# Patient Record
Sex: Female | Born: 1990
Health system: Southern US, Community
[De-identification: ages and names within clinical notes are randomized; demographics above are authoritative.]

## PROBLEM LIST (undated history)

## (undated) ENCOUNTER — Inpatient Hospital Stay (HOSPITAL_COMMUNITY): Payer: Self-pay

## (undated) DIAGNOSIS — F209 Schizophrenia, unspecified: Secondary | ICD-10-CM

## (undated) DIAGNOSIS — M419 Scoliosis, unspecified: Secondary | ICD-10-CM

## (undated) DIAGNOSIS — E559 Vitamin D deficiency, unspecified: Secondary | ICD-10-CM

## (undated) DIAGNOSIS — F1920 Other psychoactive substance dependence, uncomplicated: Secondary | ICD-10-CM

## (undated) DIAGNOSIS — R87610 Atypical squamous cells of undetermined significance on cytologic smear of cervix (ASC-US): Secondary | ICD-10-CM

## (undated) DIAGNOSIS — F319 Bipolar disorder, unspecified: Secondary | ICD-10-CM

## (undated) DIAGNOSIS — J45909 Unspecified asthma, uncomplicated: Secondary | ICD-10-CM

## (undated) DIAGNOSIS — Z3490 Encounter for supervision of normal pregnancy, unspecified, unspecified trimester: Secondary | ICD-10-CM

## (undated) HISTORY — DX: Unspecified asthma, uncomplicated: J45.909

## (undated) HISTORY — DX: Other psychoactive substance dependence, uncomplicated: F19.20

## (undated) HISTORY — PX: TYMPANOSTOMY: SHX2586

## (undated) HISTORY — DX: Atypical squamous cells of undetermined significance on cytologic smear of cervix (ASC-US): R87.610

## (undated) HISTORY — PX: MOUTH SURGERY: SHX715

## (undated) HISTORY — DX: Vitamin D deficiency, unspecified: E55.9

---
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
## 2015-05-26 NOTE — L&D Delivery Note (Signed)
Delivery Note  SVD viable female Apgars 9,9 over 2nd degree MLE.  Placenta delivered spontaneously intact with 3VC. Repair with 2-0 Chromic with good support and hemostasis noted and R/V exam confirms.  PH art was sent.  [place] cord blood was not done.  Mother and baby were doing well.  EBL 200cc  Candice Campavid Sayan Aldava, MD

## 2015-09-18 LAB — OB RESULTS CONSOLE ABO/RH: RH TYPE: POSITIVE

## 2015-09-18 LAB — OB RESULTS CONSOLE GC/CHLAMYDIA
Chlamydia: NEGATIVE
GC PROBE AMP, GENITAL: NEGATIVE

## 2015-09-18 LAB — OB RESULTS CONSOLE RPR: RPR: NONREACTIVE

## 2015-09-18 LAB — OB RESULTS CONSOLE RUBELLA ANTIBODY, IGM: Rubella: IMMUNE

## 2015-09-18 LAB — OB RESULTS CONSOLE ANTIBODY SCREEN: Antibody Screen: NEGATIVE

## 2015-09-18 LAB — OB RESULTS CONSOLE HEPATITIS B SURFACE ANTIGEN: Hepatitis B Surface Ag: NEGATIVE

## 2015-09-18 LAB — OB RESULTS CONSOLE HIV ANTIBODY (ROUTINE TESTING): HIV: NONREACTIVE

## 2015-10-27 ENCOUNTER — Encounter (HOSPITAL_COMMUNITY): Payer: Self-pay

## 2015-10-27 ENCOUNTER — Inpatient Hospital Stay (HOSPITAL_COMMUNITY)
Admission: AD | Admit: 2015-10-27 | Discharge: 2015-10-27 | Disposition: A | Discharge status: Home / Self Care | Payer: 59 | Source: Ambulatory Visit | Attending: Obstetrics and Gynecology | Admitting: Obstetrics and Gynecology

## 2015-10-27 DIAGNOSIS — G8929 Other chronic pain: Secondary | ICD-10-CM | POA: Diagnosis not present

## 2015-10-27 DIAGNOSIS — Z3A20 20 weeks gestation of pregnancy: Secondary | ICD-10-CM | POA: Diagnosis not present

## 2015-10-27 DIAGNOSIS — O210 Mild hyperemesis gravidarum: Secondary | ICD-10-CM | POA: Diagnosis not present

## 2015-10-27 DIAGNOSIS — O99332 Smoking (tobacco) complicating pregnancy, second trimester: Secondary | ICD-10-CM | POA: Diagnosis not present

## 2015-10-27 DIAGNOSIS — M549 Dorsalgia, unspecified: Secondary | ICD-10-CM | POA: Diagnosis not present

## 2015-10-27 DIAGNOSIS — M419 Scoliosis, unspecified: Secondary | ICD-10-CM | POA: Diagnosis not present

## 2015-10-27 DIAGNOSIS — R197 Diarrhea, unspecified: Secondary | ICD-10-CM

## 2015-10-27 DIAGNOSIS — O219 Vomiting of pregnancy, unspecified: Secondary | ICD-10-CM

## 2015-10-27 DIAGNOSIS — R109 Unspecified abdominal pain: Secondary | ICD-10-CM | POA: Diagnosis present

## 2015-10-27 DIAGNOSIS — F17210 Nicotine dependence, cigarettes, uncomplicated: Secondary | ICD-10-CM | POA: Insufficient documentation

## 2015-10-27 HISTORY — DX: Scoliosis, unspecified: M41.9

## 2015-10-27 LAB — URINALYSIS, ROUTINE W REFLEX MICROSCOPIC
Bilirubin Urine: NEGATIVE
GLUCOSE, UA: NEGATIVE mg/dL
Hgb urine dipstick: NEGATIVE
Ketones, ur: NEGATIVE mg/dL
LEUKOCYTES UA: NEGATIVE
Nitrite: NEGATIVE
PH: 7.5 (ref 5.0–8.0)
Protein, ur: NEGATIVE mg/dL
SPECIFIC GRAVITY, URINE: 1.015 (ref 1.005–1.030)

## 2015-10-27 MED ORDER — PROMETHAZINE HCL 25 MG PO TABS: 25.0000 mg | ORAL_TABLET | Freq: Four times a day (QID) | ORAL | Status: DC | PRN

## 2015-10-27 MED ORDER — ONDANSETRON 8 MG PO TBDP
8.0000 mg | ORAL_TABLET | Freq: Once | ORAL | Status: AC
  Administered 2015-10-27: 8 mg via ORAL
  Filled 2015-10-27: qty 1

## 2015-10-27 MED ORDER — CYCLOBENZAPRINE HCL 10 MG PO TABS: 10.0000 mg | ORAL_TABLET | Freq: Three times a day (TID) | ORAL | Status: DC | PRN

## 2015-10-27 NOTE — Discharge Instructions (Signed)
Back Pain in Pregnancy °Back pain during pregnancy is common. It happens in about half of all pregnancies. It is important for you and your baby that you remain active during your pregnancy. If you feel that back pain is not allowing you to remain active or sleep well, it is time to see your caregiver. Back pain may be caused by several factors related to changes during your pregnancy. Fortunately, unless you had trouble with your back before your pregnancy, the pain is likely to get better after you deliver. °Low back pain usually occurs between the fifth and seventh months of pregnancy. It can, however, happen in the first couple months. Factors that increase the risk of back problems include:  °· Previous back problems. °· Injury to your back. °· Having twins or multiple births. °· A chronic cough. °· Stress. °· Job-related repetitive motions. °· Muscle or spinal disease in the back. °· Family history of back problems, ruptured (herniated) discs, or osteoporosis. °· Depression, anxiety, and panic attacks. °CAUSES  °· When you are pregnant, your body produces a hormone called relaxin. This hormone makes the ligaments connecting the low back and pubic bones more flexible. This flexibility allows the baby to be delivered more easily. When your ligaments are loose, your muscles need to work harder to support your back. Soreness in your back can come from tired muscles. Soreness can also come from back tissues that are irritated since they are receiving less support. °· As the baby grows, it puts pressure on the nerves and blood vessels in your pelvis. This can cause back pain. °· As the baby grows and gets heavier during pregnancy, the uterus pushes the stomach muscles forward and changes your center of gravity. This makes your back muscles work harder to maintain good posture. °SYMPTOMS  °Lumbar pain during pregnancy °Lumbar pain during pregnancy usually occurs at or above the waist in the center of the back. There  may be pain and numbness that radiates into your leg or foot. This is similar to low back pain experienced by non-pregnant women. It usually increases with sitting for long periods of time, standing, or repetitive lifting. Tenderness may also be present in the muscles along your upper back. °Posterior pelvic pain during pregnancy °Pain in the back of the pelvis is more common than lumbar pain in pregnancy. It is a deep pain felt in your side at the waistline, or across the tailbone (sacrum), or in both places. You may have pain on one or both sides. This pain can also go into the buttocks and backs of the upper thighs. Pubic and groin pain may also be present. The pain does not quickly resolve with rest, and morning stiffness may also be present. °Pelvic pain during pregnancy can be brought on by most activities. A high level of fitness before and during pregnancy may or may not prevent this problem. Labor pain is usually 1 to 2 minutes apart, lasts for about 1 minute, and involves a bearing down feeling or pressure in your pelvis. However, if you are at term with the pregnancy, constant low back pain can be the beginning of early labor, and you should be aware of this. °DIAGNOSIS  °X-rays of the back should not be done during the first 12 to 14 weeks of the pregnancy and only when absolutely necessary during the rest of the pregnancy. MRIs do not give off radiation and are safe during pregnancy. MRIs also should only be done when absolutely necessary. °HOME CARE INSTRUCTIONS °· Exercise   as directed by your caregiver. Exercise is the most effective way to prevent or manage back pain. If you have a back problem, it is especially important to avoid sports that require sudden body movements. Swimming and walking are great activities. °· Do not stand in one place for long periods of time. °· Do not wear high heels. °· Sit in chairs with good posture. Use a pillow on your lower back if necessary. Make sure your head  rests over your shoulders and is not hanging forward. °· Try sleeping on your side, preferably the left side, with a pillow or two between your legs. If you are sore after a night's rest, your bed may be too soft. Try placing a board between your mattress and box spring. °· Listen to your body when lifting. If you are experiencing pain, ask for help or try bending your knees more so you can use your leg muscles rather than your back muscles. Squat down when picking up something from the floor. Do not bend over. °· Eat a healthy diet. Try to gain weight within your caregiver's recommendations. °· Use heat or cold packs 3 to 4 times a day for 15 minutes to help with the pain. °· Only take over-the-counter or prescription medicines for pain, discomfort, or fever as directed by your caregiver. °Sudden (acute) back pain °· Use bed rest for only the most extreme, acute episodes of back pain. Prolonged bed rest over 48 hours will aggravate your condition. °· Ice is very effective for acute conditions. °¨ Put ice in a plastic bag. °¨ Place a towel between your skin and the bag. °¨ Leave the ice on for 10 to 20 minutes every 2 hours, or as needed. °· Using heat packs for 30 minutes prior to activities is also helpful. °Continued back pain °See your caregiver if you have continued problems. Your caregiver can help or refer you for appropriate physical therapy. With conditioning, most back problems can be avoided. Sometimes, a more serious issue may be the cause of back pain. You should be seen right away if new problems seem to be developing. Your caregiver may recommend: °· A maternity girdle. °· An elastic sling. °· A back brace. °· A massage therapist or acupuncture. °SEEK MEDICAL CARE IF:  °· You are not able to do most of your daily activities, even when taking the pain medicine you were given. °· You need a referral to a physical therapist or chiropractor. °· You want to try acupuncture. °SEEK IMMEDIATE MEDICAL CARE  IF: °· You develop numbness, tingling, weakness, or problems with the use of your arms or legs. °· You develop severe back pain that is no longer relieved with medicines. °· You have a sudden change in bowel or bladder control. °· You have increasing pain in other areas of the body. °· You develop shortness of breath, dizziness, or fainting. °· You develop nausea, vomiting, or sweating. °· You have back pain which is similar to labor pains. °· You have back pain along with your water breaking or vaginal bleeding. °· You have back pain or numbness that travels down your leg. °· Your back pain developed after you fell. °· You develop pain on one side of your back. You may have a kidney stone. °· You see blood in your urine. You may have a bladder infection or kidney stone. °· You have back pain with blisters. You may have shingles. °Back pain is fairly common during pregnancy but should not be accepted as just part of   the process. Back pain should always be treated as soon as possible. This will make your pregnancy as pleasant as possible.   This information is not intended to replace advice given to you by your health care provider. Make sure you discuss any questions you have with your health care provider.   Document Released: 08/19/2005 Document Revised: 08/03/2011 Document Reviewed: 09/30/2010 Elsevier Interactive Patient Education 2016 Elsevier Inc.     Diarrhea Diarrhea is watery poop (stool). It can make you feel weak, tired, thirsty, or give you a dry mouth (signs of dehydration). Watery poop is a sign of another problem, most often an infection. It often lasts 2-3 days. It can last longer if it is a sign of something serious. Take care of yourself as told by your doctor. HOME CARE   Drink 1 cup (8 ounces) of fluid each time you have watery poop.  Do not drink the following fluids:  Those that contain simple sugars (fructose, glucose, galactose, lactose, sucrose, maltose).  Sports  drinks.  Fruit juices.  Whole milk products.  Sodas.  Drinks with caffeine (coffee, tea, soda) or alcohol.  Oral rehydration solution may be used if the doctor says it is okay. You may make your own solution. Follow this recipe:   - teaspoon table salt.   teaspoon baking soda.   teaspoon salt substitute containing potassium chloride.  1 tablespoons sugar.  1 liter (34 ounces) of water.  Avoid the following foods:  High fiber foods, such as raw fruits and vegetables.  Nuts, seeds, and whole grain breads and cereals.   Those that are sweetened with sugar alcohols (xylitol, sorbitol, mannitol).  Try eating the following foods:  Starchy foods, such as rice, toast, pasta, low-sugar cereal, oatmeal, baked potatoes, crackers, and bagels.  Bananas.  Applesauce.  Eat probiotic-rich foods, such as yogurt and milk products that are fermented.  Wash your hands well after each time you have watery poop.  Only take medicine as told by your doctor.  Take a warm bath to help lessen burning or pain from having watery poop. GET HELP RIGHT AWAY IF:   You cannot drink fluids without throwing up (vomiting).  You keep throwing up.  You have blood in your poop, or your poop looks black and tarry.  You do not pee (urinate) in 6-8 hours, or there is only a small amount of very dark pee.  You have belly (abdominal) pain that gets worse or stays in the same spot (localizes).  You are weak, dizzy, confused, or light-headed.  You have a very bad headache.  Your watery poop gets worse or does not get better.  You have a fever or lasting symptoms for more than 2-3 days.  You have a fever and your symptoms suddenly get worse. MAKE SURE YOU:   Understand these instructions.  Will watch your condition.  Will get help right away if you are not doing well or get worse.   This information is not intended to replace advice given to you by your health care provider. Make sure you  discuss any questions you have with your health care provider.   Document Released: 10/28/2007 Document Revised: 06/01/2014 Document Reviewed: 01/17/2012 Elsevier Interactive Patient Education [employer]2016 Elsevier Inc.

## 2015-10-27 NOTE — MAU Note (Signed)
Patient presents with c/o diarrhea x 2 days with 3 episodes today, vomiting has been throughout pregnancy, having back pain and lower abdominal pain.

## 2015-10-27 NOTE — MAU Provider Note (Signed)
History     CSN: [id number]  Arrival date and time: 10/27/15 1328   First Provider Initiated Contact with Patient 10/27/15 1428      Chief Complaint  Patient presents with  . Morning Sickness  . Diarrhea  . Abdominal Pain  . Back Pain   HPI  Kara Love is a 25 y.o. G1P0 at 951w5d who presents with multiple complaints including n/v/d, abdominal pain, & back pain.  -N/v throughout pregnancy. Vomited twice today; states vomits daily. Does not have medications at home for nausea. Is able to tolerate foods/liquids -Reports diarrhea since yesterday x 3 stools. States 2 stools yesterday were loose & had 1 watery stool this morning. Denies blood in stool, fever, recent abx or hospitalization, or sick contacts.  -LLQ pain since yesterday. Rates 6/10. Describes as constant sore pain that also feels like menstrual cramps. Has not treated.  -Low back pain that feels like spasms. Reports history of chronic back pain. Rates 5/10. Has not treated. Reports using tylenol at the beginning of pregnancy without relief. States she has used flexeril in that past without relief. Pain is worse while working & states she is about to lose her job d/t the pain. States she was in the office a few weeks ago & was told by Dr. Renaldo FiddlerAdkins that the only medication that would be safe for her to take during pregnancy was hydrocodone but that Dr. Renaldo FiddlerAdkins forgot to prescribe it for her.   OB History    Gravida Para Term Preterm AB TAB SAB Ectopic Multiple Living   1               Past Medical History  Diagnosis Date  . Scoliosis     Past Surgical History  Procedure Laterality Date  . Tympanostomy      History reviewed. No pertinent family history.  Social History  Substance Use Topics  . Smoking status: Current Every Day Smoker -- 0.50 packs/day    Types: Cigarettes  . Smokeless tobacco: None  . Alcohol Use: No    Allergies:  Allergies  Allergen Reactions  . Cefzil [Cefprozil] Rash    Prescriptions  prior to admission  Medication Sig Dispense Refill Last Dose  . Prenatal Vit-Fe Fumarate-FA (PRENATAL MULTIVITAMIN) TABS tablet Take 1 tablet by mouth daily at 12 noon.   Past Week at Unknown time    Review of Systems  Constitutional: Negative.   Gastrointestinal: Positive for nausea, vomiting, abdominal pain and diarrhea. Negative for constipation, blood in stool and melena.  Genitourinary: Negative for dysuria and flank pain.       No vaginal bleeding or LOF  Musculoskeletal: Positive for back pain.   Physical Exam   Blood pressure 113/55, pulse 85, temperature 98.2 F (36.8 C), temperature source Oral, resp. rate 18, height 5\' 2"  (1.575 m), weight 131 lb 1.9 oz (59.476 kg).  Physical Exam  Nursing note and vitals reviewed. Constitutional: She is oriented to person, place, and time. She appears well-developed and well-nourished. No distress.  HENT:  Head: Normocephalic and atraumatic.  Eyes: Conjunctivae are normal. Right eye exhibits no discharge. Left eye exhibits no discharge. No scleral icterus.  Neck: Normal range of motion.  Cardiovascular: Normal rate, regular rhythm and normal heart sounds.   No murmur heard. Respiratory: Effort normal and breath sounds normal. No respiratory distress. She has no wheezes.  GI: Soft. Bowel sounds are normal. She exhibits distension. There is no tenderness. There is no rebound and no guarding.  Neurological:  She is alert and oriented to person, place, and time.  Skin: Skin is warm and dry. She is not diaphoretic.  Psychiatric: She has a normal mood and affect. Her behavior is normal. Judgment and thought content normal.    MAU Course  Procedures Results for orders placed or performed during the hospital encounter of 10/27/15 (from the past 24 hour(s))  Urinalysis, Routine w reflex microscopic (not at [facility])     Status: None   Collection Time: 10/27/15  1:50 PM  Result Value Ref Range   Color, Urine YELLOW YELLOW   APPearance CLEAR CLEAR    Specific Gravity, Urine 1.015 1.005 - 1.030   pH 7.5 5.0 - 8.0   Glucose, UA NEGATIVE NEGATIVE mg/dL   Hgb urine dipstick NEGATIVE NEGATIVE   Bilirubin Urine NEGATIVE NEGATIVE   Ketones, ur NEGATIVE NEGATIVE mg/dL   Protein, ur NEGATIVE NEGATIVE mg/dL   Nitrite NEGATIVE NEGATIVE   Leukocytes, UA NEGATIVE NEGATIVE    MDM FHT 151 by doppler Cervix closed No vomiting or diarrhea while in MAU Zofran  odt  Pt in no apparent distress VSS S/w Dr. Rana Snare. Discharge home with rx for phenergan & flexeril. Instruct pt to apply ice/heat/massage to affected area on back & f/u with Dr. Renaldo Fiddler if symptoms don't improve in 2 days.   Assessment and Plan  A: 1. Chronic back pain   2. Nausea and vomiting during pregnancy prior to [redacted] weeks gestation   3. Diarrhea, unspecified type     P; Discharge home Rx phenergan & flexeril Recommended maternity support belt Alternate ice/heat applications to back F/u with Dr. Renaldo Fiddler if symptoms don't improve BRAT diet for diarrhea Discussed reasons to return to MAU  Judeth Horn 10/27/2015, 2:14 PM

## 2015-11-10 ENCOUNTER — Encounter (HOSPITAL_COMMUNITY): Payer: Self-pay | Admitting: *Deleted

## 2015-11-10 ENCOUNTER — Inpatient Hospital Stay (HOSPITAL_COMMUNITY)
Admission: AD | Admit: 2015-11-10 | Discharge: 2015-11-10 | Disposition: A | Discharge status: Home / Self Care | Payer: 59 | Source: Ambulatory Visit | Attending: Obstetrics & Gynecology | Admitting: Obstetrics & Gynecology

## 2015-11-10 DIAGNOSIS — M4120 Other idiopathic scoliosis, site unspecified: Secondary | ICD-10-CM

## 2015-11-10 DIAGNOSIS — O26892 Other specified pregnancy related conditions, second trimester: Secondary | ICD-10-CM | POA: Diagnosis not present

## 2015-11-10 DIAGNOSIS — F17210 Nicotine dependence, cigarettes, uncomplicated: Secondary | ICD-10-CM | POA: Insufficient documentation

## 2015-11-10 DIAGNOSIS — O99891 Other specified diseases and conditions complicating pregnancy: Secondary | ICD-10-CM

## 2015-11-10 DIAGNOSIS — Z3A22 22 weeks gestation of pregnancy: Secondary | ICD-10-CM | POA: Diagnosis not present

## 2015-11-10 DIAGNOSIS — O99332 Smoking (tobacco) complicating pregnancy, second trimester: Secondary | ICD-10-CM | POA: Diagnosis not present

## 2015-11-10 DIAGNOSIS — M419 Scoliosis, unspecified: Secondary | ICD-10-CM | POA: Diagnosis not present

## 2015-11-10 DIAGNOSIS — M549 Dorsalgia, unspecified: Secondary | ICD-10-CM | POA: Diagnosis not present

## 2015-11-10 DIAGNOSIS — O9989 Other specified diseases and conditions complicating pregnancy, childbirth and the puerperium: Secondary | ICD-10-CM

## 2015-11-10 MED ORDER — OXYCODONE-ACETAMINOPHEN 5-325 MG PO TABS
1.0000 | ORAL_TABLET | Freq: Once | ORAL | Status: AC
  Administered 2015-11-10: 1 via ORAL
  Filled 2015-11-10: qty 1

## 2015-11-10 NOTE — MAU Provider Note (Signed)
  History     CSN: [id number]  Arrival date and time: 11/10/15 1418   None     Chief Complaint  Patient presents with  . Back Pain   HPI Pt is 2963w5d pregnant G1P0 who has hx of scoliosis and states that she was on percocet prior to pregnancy from her PCP.  Pt was seen in MAU on 10/27/2015 and given a prescription for flexeril.  Pt states that is not working for patient. Pt was to contact office for follow up if the flexeril did not help.(not sure why pt was unable to contact office for follow up)  Pt states her back is becoming more painful and having difficulty working.  Pt also has prescription for phenergan which she has also tried with tylenol has not helped either. Pt states she called call a nurse at the office and they told her to come to MAU. Pt denies abd pain, vaginal bleeding or spotting  Past Medical History  Diagnosis Date  . Scoliosis     Past Surgical History  Procedure Laterality Date  . Tympanostomy      No family history on file.  Social History  Substance Use Topics  . Smoking status: Current Every Day Smoker -- 0.50 packs/day    Types: Cigarettes  . Smokeless tobacco: None  . Alcohol Use: No    Allergies:  Allergies  Allergen Reactions  . Cefzil [Cefprozil] Rash    Prescriptions prior to admission  Medication Sig Dispense Refill Last Dose  . cyclobenzaprine (FLEXERIL) 10 MG tablet Take 1 tablet (10 mg total) by mouth 3 (three) times daily as needed for muscle spasms. 20 tablet 0   . Prenatal Vit-Fe Fumarate-FA (PRENATAL MULTIVITAMIN) TABS tablet Take 1 tablet by mouth daily at 12 noon.   Past Week at Unknown time  . promethazine (PHENERGAN) 25 MG tablet Take 1 tablet (25 mg total) by mouth every 6 (six) hours as needed for nausea or vomiting. 30 tablet 0     Review of Systems  Constitutional: Negative for fever and chills.  Gastrointestinal: Negative for nausea, vomiting, abdominal pain, diarrhea and constipation.  Genitourinary: Negative for  dysuria.  Musculoskeletal: Positive for back pain.  Neurological: Negative for headaches.   Physical Exam   Blood pressure 109/56, pulse 91, temperature 98.2 F (36.8 C), resp. rate 18, height 5\' 2"  (1.575 m), weight 133 lb (60.328 kg).  Physical Exam  Nursing note and vitals reviewed. Constitutional: She is oriented to person, place, and time. She appears well-developed and well-nourished. No distress.  Eyes: Pupils are equal, round, and reactive to light.  Neck: Normal range of motion. Neck supple.  Cardiovascular: Normal rate.   Respiratory: Effort normal.  No CVA tenderness  GI: Soft. She exhibits no distension. There is no tenderness.  Musculoskeletal: Normal range of motion. She exhibits no edema.  Neurological: She is alert and oriented to person, place, and time.  Skin: Skin is warm and dry.  Psychiatric: She has a normal mood and affect.    MAU Course  Procedures FHT 155 with doppler Discussed with Dr. Langston MaskerMorris- will give pt one Percocet- pt will need to follow up with office tomorrow. -( pt very unhappy that she came here to MAU and did not get a prescription) Assessment and Plan  Back pain in pregnancy - second trimester Scoliosis F/u with OB tomorrow  Pamelia HoitLINEBERRY,Jayona Mccaig 11/10/2015, 2:50 PM

## 2015-11-10 NOTE — Discharge Instructions (Signed)

## 2015-11-10 NOTE — MAU Note (Signed)
Back pain since prior to pregnancy. Hx of scoliosis.  Took flexeril for 1 week with little relief

## 2016-01-10 ENCOUNTER — Emergency Department (HOSPITAL_COMMUNITY)
Admission: EM | Admit: 2016-01-10 | Discharge: 2016-01-11 | Disposition: A | Discharge status: Home / Self Care | Payer: 59 | Attending: Emergency Medicine | Admitting: Emergency Medicine

## 2016-01-10 ENCOUNTER — Encounter (HOSPITAL_COMMUNITY): Payer: Self-pay

## 2016-01-10 ENCOUNTER — Emergency Department (HOSPITAL_COMMUNITY): Payer: 59

## 2016-01-10 DIAGNOSIS — F17210 Nicotine dependence, cigarettes, uncomplicated: Secondary | ICD-10-CM | POA: Diagnosis not present

## 2016-01-10 DIAGNOSIS — S0181XA Laceration without foreign body of other part of head, initial encounter: Secondary | ICD-10-CM | POA: Diagnosis not present

## 2016-01-10 DIAGNOSIS — Y92410 Unspecified street and highway as the place of occurrence of the external cause: Secondary | ICD-10-CM | POA: Insufficient documentation

## 2016-01-10 DIAGNOSIS — Y999 Unspecified external cause status: Secondary | ICD-10-CM | POA: Diagnosis not present

## 2016-01-10 DIAGNOSIS — Z79899 Other long term (current) drug therapy: Secondary | ICD-10-CM | POA: Insufficient documentation

## 2016-01-10 DIAGNOSIS — S0191XA Laceration without foreign body of unspecified part of head, initial encounter: Secondary | ICD-10-CM

## 2016-01-10 DIAGNOSIS — Z3A31 31 weeks gestation of pregnancy: Secondary | ICD-10-CM | POA: Diagnosis not present

## 2016-01-10 DIAGNOSIS — O9A213 Injury, poisoning and certain other consequences of external causes complicating pregnancy, third trimester: Secondary | ICD-10-CM | POA: Diagnosis not present

## 2016-01-10 DIAGNOSIS — F1910 Other psychoactive substance abuse, uncomplicated: Secondary | ICD-10-CM | POA: Diagnosis not present

## 2016-01-10 DIAGNOSIS — O99333 Smoking (tobacco) complicating pregnancy, third trimester: Secondary | ICD-10-CM | POA: Diagnosis not present

## 2016-01-10 DIAGNOSIS — Y9389 Activity, other specified: Secondary | ICD-10-CM | POA: Diagnosis not present

## 2016-01-10 DIAGNOSIS — S0990XA Unspecified injury of head, initial encounter: Secondary | ICD-10-CM | POA: Diagnosis present

## 2016-01-10 DIAGNOSIS — Z5181 Encounter for therapeutic drug level monitoring: Secondary | ICD-10-CM | POA: Insufficient documentation

## 2016-01-10 DIAGNOSIS — Z3490 Encounter for supervision of normal pregnancy, unspecified, unspecified trimester: Secondary | ICD-10-CM

## 2016-01-10 HISTORY — DX: Encounter for supervision of normal pregnancy, unspecified, unspecified trimester: Z34.90

## 2016-01-10 LAB — COMPREHENSIVE METABOLIC PANEL
ALK PHOS: 73 U/L (ref 38–126)
ALT: 12 U/L — AB (ref 14–54)
AST: 20 U/L (ref 15–41)
Albumin: 3.4 g/dL — ABNORMAL LOW (ref 3.5–5.0)
Anion gap: 6 (ref 5–15)
BILIRUBIN TOTAL: 0.7 mg/dL (ref 0.3–1.2)
BUN: 6 mg/dL (ref 6–20)
CALCIUM: 8.3 mg/dL — AB (ref 8.9–10.3)
CHLORIDE: 105 mmol/L (ref 101–111)
CO2: 23 mmol/L (ref 22–32)
CREATININE: 0.5 mg/dL (ref 0.44–1.00)
Glucose, Bld: 90 mg/dL (ref 65–99)
Potassium: 3.1 mmol/L — ABNORMAL LOW (ref 3.5–5.1)
Sodium: 134 mmol/L — ABNORMAL LOW (ref 135–145)
Total Protein: 6.3 g/dL — ABNORMAL LOW (ref 6.5–8.1)

## 2016-01-10 LAB — TYPE AND SCREEN
ABO/RH(D): A POS
Antibody Screen: NEGATIVE

## 2016-01-10 LAB — PROTIME-INR
INR: 0.95
Prothrombin Time: 12.7 seconds (ref 11.4–15.2)

## 2016-01-10 LAB — CBC
HCT: 35.2 % — ABNORMAL LOW (ref 36.0–46.0)
Hemoglobin: 12 g/dL (ref 12.0–15.0)
MCH: 31.7 pg (ref 26.0–34.0)
MCHC: 34.1 g/dL (ref 30.0–36.0)
MCV: 92.9 fL (ref 78.0–100.0)
PLATELETS: 281 10*3/uL (ref 150–400)
RBC: 3.79 MIL/uL — AB (ref 3.87–5.11)
RDW: 12.5 % (ref 11.5–15.5)
WBC: 18.7 10*3/uL — AB (ref 4.0–10.5)

## 2016-01-10 LAB — ETHANOL

## 2016-01-10 MED ORDER — SODIUM CHLORIDE 0.9 % IV SOLN: 1000.0000 mL | INTRAVENOUS | Status: DC

## 2016-01-10 MED ORDER — SODIUM CHLORIDE 0.9 % IV SOLN
1000.0000 mL | Freq: Once | INTRAVENOUS | Status: AC
  Administered 2016-01-10: 1000 mL via INTRAVENOUS
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
## 2016-01-10 NOTE — ED Notes (Signed)
Patient transported to CT 

## 2016-01-10 NOTE — ED Notes (Addendum)
While placing patients belongings into a belongings bag, a small bag with 4 tablets (2 light green rectangular pills and 2 white oval pills) fell out of the patients pocket.  Pt admitted that they were xanax and vicodin but that she hadn't taken any recently.
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
## 2016-01-10 NOTE — Discharge Instructions (Signed)
Return if you have any pain in your abdomen. Return if you do not feel the baby moving or have vaginal bleeding.  Recheck with your doctor this week.
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
## 2016-01-11 LAB — RAPID URINE DRUG SCREEN, HOSP PERFORMED
Amphetamines: NOT DETECTED
Barbiturates: NOT DETECTED
Benzodiazepines: POSITIVE — AB
COCAINE: NOT DETECTED
OPIATES: POSITIVE — AB
TETRAHYDROCANNABINOL: POSITIVE — AB

## 2016-01-11 LAB — URINE MICROSCOPIC-ADD ON

## 2016-01-11 LAB — URINALYSIS, ROUTINE W REFLEX MICROSCOPIC
BILIRUBIN URINE: NEGATIVE
Glucose, UA: NEGATIVE mg/dL
NITRITE: NEGATIVE
PROTEIN: NEGATIVE mg/dL
pH: 6.5 (ref 5.0–8.0)

## 2016-01-11 NOTE — ED Notes (Signed)
[facility]. Stated everything looks good on their end. Pt can be discharged after 4 hour monitor time.

## 2016-01-11 NOTE — ED Notes (Signed)
Pt alert & oriented x4, stable gait. Patient given discharge instructions, paperwork & prescription(s). Patient  instructed to stop at the registration desk to finish any additional paperwork. Patient verbalized understanding. Pt left department w/ no further questions. 

## 2016-01-24 ENCOUNTER — Ambulatory Visit (HOSPITAL_COMMUNITY)
Admission: RE | Admit: 2016-01-24 | Discharge: 2016-01-24 | Disposition: A | Discharge status: Home / Self Care | Payer: No Typology Code available for payment source | Source: Ambulatory Visit | Attending: Obstetrics and Gynecology | Admitting: Obstetrics and Gynecology

## 2016-01-28 ENCOUNTER — Ambulatory Visit (HOSPITAL_COMMUNITY): Payer: No Typology Code available for payment source

## 2016-01-31 ENCOUNTER — Encounter (HOSPITAL_COMMUNITY): Payer: Self-pay

## 2016-01-31 ENCOUNTER — Ambulatory Visit (HOSPITAL_COMMUNITY)
Admission: RE | Admit: 2016-01-31 | Discharge: 2016-01-31 | Disposition: A | Discharge status: Home / Self Care | Payer: 59 | Source: Ambulatory Visit | Attending: Obstetrics and Gynecology | Admitting: Obstetrics and Gynecology

## 2016-01-31 DIAGNOSIS — M419 Scoliosis, unspecified: Secondary | ICD-10-CM | POA: Insufficient documentation

## 2016-01-31 DIAGNOSIS — O99323 Drug use complicating pregnancy, third trimester: Secondary | ICD-10-CM | POA: Diagnosis present

## 2016-01-31 DIAGNOSIS — Z3A34 34 weeks gestation of pregnancy: Secondary | ICD-10-CM | POA: Diagnosis not present

## 2016-01-31 DIAGNOSIS — O99333 Smoking (tobacco) complicating pregnancy, third trimester: Secondary | ICD-10-CM | POA: Diagnosis not present

## 2016-01-31 DIAGNOSIS — F17210 Nicotine dependence, cigarettes, uncomplicated: Secondary | ICD-10-CM | POA: Diagnosis not present

## 2016-01-31 DIAGNOSIS — F1190 Opioid use, unspecified, uncomplicated: Secondary | ICD-10-CM | POA: Insufficient documentation

## 2016-01-31 LAB — OB RESULTS CONSOLE GBS: STREP GROUP B AG: NEGATIVE

## 2016-01-31 NOTE — Progress Notes (Signed)
MATERNAL FETAL MEDICINE CONSULT  Patient Name: Kara StainKaitlyn M Love Medical Record Number:  [id number] Date of Birth: 06-05-1990 Requesting Physician Name:  Kara CairoGretchen Adkins, MD Date of Service: 01/31/2016  Chief Complaint Opiate use in setting of chronic back pain  History of Present Illness Kara Love was seen today secondary to opiate use in the setting of chronic back pain at the request of Kara CairoGretchen Adkins, MD.  The patient is a 25 y.o. G1P0,at 10353w3d with an EDD of 03/10/2016, by Ultrasound dating method.  Kara Love has chronic back pain secondary to scoliosis.  This has progressively worsened throughout pregnancy such that it is most often a 7/10 pain.  Earlier in pregnancy she was given percocet for the pain, but his was not renewed due to concern for neonatal abstinence.  She has no had any opiates in several weeks now and is managing with her pain.  She has had to be off from work at [employer] as the lifting involved in her job exacerbated her back pain.  She has no other issues or complaints today.  Review of Systems Pertinent items are noted in HPI.  Patient History OB History  Gravida Para Term Preterm AB Living  1            SAB TAB Ectopic Multiple Live Births               # Outcome Date GA Lbr Len/2nd Weight Sex Delivery Anes PTL Lv  1 Current               Past Medical History:  Diagnosis Date  . Pregnant   . Scoliosis     Past Surgical History:  Procedure Laterality Date  . TYMPANOSTOMY      Social History   Social History  . Marital status: Single    Spouse name: N/A  . Number of children: N/A  . Years of education: N/A   Social History Main Topics  . Smoking status: Current Every Day Smoker    Packs/day: 0.50    Types: Cigarettes  . Smokeless tobacco: Never Used  . Alcohol use No  . Drug use:     Types: Benzodiazepines, Cocaine, Heroin, Marijuana     Comment: stopped THC 3 wks ago, denies heroin or cocaine during pregnancy  . Sexual activity:  Yes    Birth control/ protection: None   Other Topics Concern  . None   Social History Narrative  . None    Family History Kara Love has no family history of mental retardation, birth defects, or genetic diseases.  Physical Examination Vitals:   01/31/16 1017  BP: 108/64  Pulse: 80   General appearance - alert, well appearing, and in no distress Mental status - alert, oriented to person, place, and time  Assessment and Recommendations 1.  Opiate use in the setting of low back pain.  As Kara Love only use opiates for a short period of time during pregnancy and is no longer taking them, the risk of neonatal abstinence is very low.  I concur with her primary OB that being off from work and physical therapy are the most appropriate treatments at this time.  Chronic opiate use for pain control is not warranted.  I spent 20 minutes with Kara Love today of which 50% was face-to-face counseling.  [facility].  Please do not hesitate to contact us with questions.   Rema FendtNITSCHE,Tasharra Nodine, MD

## 2016-02-04 ENCOUNTER — Other Ambulatory Visit (HOSPITAL_COMMUNITY): Payer: Self-pay

## 2016-02-25 ENCOUNTER — Institutional Professional Consult (permissible substitution): Payer: Self-pay | Admitting: Pediatrics

## 2016-03-04 ENCOUNTER — Encounter (HOSPITAL_COMMUNITY): Payer: Self-pay | Admitting: *Deleted

## 2016-03-04 ENCOUNTER — Telehealth (HOSPITAL_COMMUNITY): Payer: Self-pay | Admitting: *Deleted

## 2016-03-04 NOTE — Telephone Encounter (Signed)
Pt admits to using marijuana one month ago as of 03/04/2016

## 2016-03-04 NOTE — Telephone Encounter (Signed)
Preadmission screen  

## 2016-03-07 ENCOUNTER — Encounter (HOSPITAL_COMMUNITY): Payer: Self-pay | Admitting: *Deleted

## 2016-03-07 ENCOUNTER — Inpatient Hospital Stay (HOSPITAL_COMMUNITY)
Admission: AD | Admit: 2016-03-07 | Discharge: 2016-03-09 | DRG: 775 | Disposition: A | Discharge status: Home / Self Care | Payer: 59 | Source: Ambulatory Visit | Attending: Obstetrics and Gynecology | Admitting: Obstetrics and Gynecology

## 2016-03-07 DIAGNOSIS — O99324 Drug use complicating childbirth: Secondary | ICD-10-CM | POA: Diagnosis present

## 2016-03-07 DIAGNOSIS — F17210 Nicotine dependence, cigarettes, uncomplicated: Secondary | ICD-10-CM | POA: Diagnosis present

## 2016-03-07 DIAGNOSIS — O99334 Smoking (tobacco) complicating childbirth: Secondary | ICD-10-CM | POA: Diagnosis present

## 2016-03-07 DIAGNOSIS — Z3A39 39 weeks gestation of pregnancy: Secondary | ICD-10-CM | POA: Diagnosis not present

## 2016-03-07 DIAGNOSIS — Z8249 Family history of ischemic heart disease and other diseases of the circulatory system: Secondary | ICD-10-CM

## 2016-03-07 DIAGNOSIS — R109 Unspecified abdominal pain: Secondary | ICD-10-CM | POA: Diagnosis present

## 2016-03-07 DIAGNOSIS — Z833 Family history of diabetes mellitus: Secondary | ICD-10-CM | POA: Diagnosis not present

## 2016-03-07 DIAGNOSIS — M419 Scoliosis, unspecified: Secondary | ICD-10-CM | POA: Diagnosis present

## 2016-03-07 DIAGNOSIS — F1110 Opioid abuse, uncomplicated: Secondary | ICD-10-CM | POA: Diagnosis present

## 2016-03-07 DIAGNOSIS — Z3403 Encounter for supervision of normal first pregnancy, third trimester: Secondary | ICD-10-CM | POA: Diagnosis present

## 2016-03-07 LAB — CBC
HCT: 37.5 % (ref 36.0–46.0)
HEMOGLOBIN: 13.1 g/dL (ref 12.0–15.0)
MCH: 31.6 pg (ref 26.0–34.0)
MCHC: 34.9 g/dL (ref 30.0–36.0)
MCV: 90.6 fL (ref 78.0–100.0)
Platelets: 306 10*3/uL (ref 150–400)
RBC: 4.14 MIL/uL (ref 3.87–5.11)
RDW: 13.6 % (ref 11.5–15.5)
WBC: 22.4 10*3/uL — ABNORMAL HIGH (ref 4.0–10.5)

## 2016-03-07 LAB — TYPE AND SCREEN
ABO/RH(D): A POS
Antibody Screen: NEGATIVE

## 2016-03-07 LAB — ABO/RH: ABO/RH(D): A POS

## 2016-03-07 MED ORDER — ONDANSETRON HCL 4 MG PO TABS: 4.0000 mg | ORAL_TABLET | ORAL | Status: DC | PRN

## 2016-03-07 MED ORDER — OXYCODONE-ACETAMINOPHEN 5-325 MG PO TABS: 2.0000 | ORAL_TABLET | ORAL | Status: DC | PRN

## 2016-03-07 MED ORDER — IBUPROFEN 600 MG PO TABS
600.0000 mg | ORAL_TABLET | Freq: Four times a day (QID) | ORAL | Status: DC
  Administered 2016-03-07 – 2016-03-09 (×6): 600 mg via ORAL
  Filled 2016-03-07 (×6): qty 1

## 2016-03-07 MED ORDER — INFLUENZA VAC SPLIT QUAD 0.5 ML IM SUSY
0.5000 mL | PREFILLED_SYRINGE | INTRAMUSCULAR | Status: AC
  Administered 2016-03-08: 0.5 mL via INTRAMUSCULAR
  Filled 2016-03-07: qty 0.5

## 2016-03-07 MED ORDER — IBUPROFEN 600 MG PO TABS
600.0000 mg | ORAL_TABLET | Freq: Once | ORAL | Status: AC
  Administered 2016-03-07: 600 mg via ORAL
  Filled 2016-03-07: qty 1

## 2016-03-07 MED ORDER — ACETAMINOPHEN 325 MG PO TABS: 650.0000 mg | ORAL_TABLET | ORAL | Status: DC | PRN

## 2016-03-07 MED ORDER — PNEUMOCOCCAL VAC POLYVALENT 25 MCG/0.5ML IJ INJ
0.5000 mL | INJECTION | INTRAMUSCULAR | Status: DC
  Filled 2016-03-07: qty 0.5

## 2016-03-07 MED ORDER — COCONUT OIL OIL: 1.0000 "application " | TOPICAL_OIL | Status: DC | PRN

## 2016-03-07 MED ORDER — DIPHENHYDRAMINE HCL 25 MG PO CAPS: 25.0000 mg | ORAL_CAPSULE | Freq: Four times a day (QID) | ORAL | Status: DC | PRN

## 2016-03-07 MED ORDER — MEASLES, MUMPS & RUBELLA VAC ~~LOC~~ INJ
0.5000 mL | INJECTION | Freq: Once | SUBCUTANEOUS | Status: DC
  Filled 2016-03-07: qty 0.5

## 2016-03-07 MED ORDER — MEDROXYPROGESTERONE ACETATE 150 MG/ML IM SUSP: 150.0000 mg | INTRAMUSCULAR | Status: DC | PRN

## 2016-03-07 MED ORDER — WITCH HAZEL-GLYCERIN EX PADS: 1.0000 "application " | MEDICATED_PAD | CUTANEOUS | Status: DC | PRN

## 2016-03-07 MED ORDER — LIDOCAINE HCL (PF) 1 % IJ SOLN
30.0000 mL | INTRAMUSCULAR | Status: DC | PRN
  Administered 2016-03-07: 30 mL via SUBCUTANEOUS
  Filled 2016-03-07: qty 30

## 2016-03-07 MED ORDER — ZOLPIDEM TARTRATE 5 MG PO TABS: 5.0000 mg | ORAL_TABLET | Freq: Every evening | ORAL | Status: DC | PRN

## 2016-03-07 MED ORDER — OXYCODONE-ACETAMINOPHEN 5-325 MG PO TABS: 1.0000 | ORAL_TABLET | ORAL | Status: DC | PRN

## 2016-03-07 MED ORDER — TETANUS-DIPHTH-ACELL PERTUSSIS 5-2.5-18.5 LF-MCG/0.5 IM SUSP: 0.5000 mL | Freq: Once | INTRAMUSCULAR | Status: DC

## 2016-03-07 MED ORDER — LACTATED RINGERS IV SOLN: 500.0000 mL | INTRAVENOUS | Status: DC | PRN

## 2016-03-07 MED ORDER — ONDANSETRON HCL 4 MG/2ML IJ SOLN: 4.0000 mg | INTRAMUSCULAR | Status: DC | PRN

## 2016-03-07 MED ORDER — OXYCODONE-ACETAMINOPHEN 5-325 MG PO TABS
2.0000 | ORAL_TABLET | ORAL | Status: DC | PRN
  Administered 2016-03-07 – 2016-03-09 (×9): 2 via ORAL
  Filled 2016-03-07 (×9): qty 2

## 2016-03-07 MED ORDER — SENNOSIDES-DOCUSATE SODIUM 8.6-50 MG PO TABS
2.0000 | ORAL_TABLET | ORAL | Status: DC
  Administered 2016-03-07 – 2016-03-08 (×2): 2 via ORAL
  Filled 2016-03-07 (×2): qty 2

## 2016-03-07 MED ORDER — DIBUCAINE 1 % RE OINT: 1.0000 "application " | TOPICAL_OINTMENT | RECTAL | Status: DC | PRN

## 2016-03-07 MED ORDER — LACTATED RINGERS IV SOLN: INTRAVENOUS | Status: DC

## 2016-03-07 MED ORDER — BENZOCAINE-MENTHOL 20-0.5 % EX AERO
1.0000 "application " | INHALATION_SPRAY | CUTANEOUS | Status: DC | PRN
  Administered 2016-03-09: 1 via TOPICAL
  Filled 2016-03-07 (×2): qty 56

## 2016-03-07 MED ORDER — CYCLOBENZAPRINE HCL 10 MG PO TABS
10.0000 mg | ORAL_TABLET | Freq: Three times a day (TID) | ORAL | Status: DC | PRN
  Filled 2016-03-07: qty 1

## 2016-03-07 MED ORDER — OXYTOCIN BOLUS FROM INFUSION: 500.0000 mL | Freq: Once | INTRAVENOUS | Status: DC

## 2016-03-07 MED ORDER — ONDANSETRON HCL 4 MG/2ML IJ SOLN: 4.0000 mg | Freq: Four times a day (QID) | INTRAMUSCULAR | Status: DC | PRN

## 2016-03-07 MED ORDER — FLEET ENEMA 7-19 GM/118ML RE ENEM: 1.0000 | ENEMA | Freq: Every day | RECTAL | Status: DC | PRN

## 2016-03-07 MED ORDER — SOD CITRATE-CITRIC ACID 500-334 MG/5ML PO SOLN: 30.0000 mL | ORAL | Status: DC | PRN

## 2016-03-07 MED ORDER — SIMETHICONE 80 MG PO CHEW: 80.0000 mg | CHEWABLE_TABLET | ORAL | Status: DC | PRN

## 2016-03-07 MED ORDER — OXYTOCIN 40 UNITS IN LACTATED RINGERS INFUSION - SIMPLE MED
2.5000 [IU]/h | INTRAVENOUS | Status: DC
  Administered 2016-03-07: 2.5 [IU]/h via INTRAVENOUS
  Filled 2016-03-07: qty 1000

## 2016-03-07 MED ORDER — PRENATAL MULTIVITAMIN CH
1.0000 | ORAL_TABLET | Freq: Every day | ORAL | Status: DC
  Administered 2016-03-08: 1 via ORAL
  Filled 2016-03-07: qty 1

## 2016-03-07 MED ORDER — OXYCODONE-ACETAMINOPHEN 10-325 MG PO TABS
1.0000 | ORAL_TABLET | ORAL | Status: DC | PRN
  Filled 2016-03-07: qty 1

## 2016-03-07 NOTE — MAU Note (Signed)
Pt states contractions started around 0530 this morning and states they are now 1-2 minutes apart.  Pt states she is feeling the baby move and denies leaking of fluid.

## 2016-03-07 NOTE — H&P (Signed)
Kara Love is a 25 y.o. female presenting for labor.  Presented at complete to MAU. Pregnancy complicated by Narcotic use, abuse.  GBS-. OB History    Gravida Para Term Preterm AB Living   1             SAB TAB Ectopic Multiple Live Births                 Past Medical History:  Diagnosis Date  . Asthma   . Drug addiction (HCC)    currently using percocet  . Pregnant   . Scoliosis    Past Surgical History:  Procedure Laterality Date  . MOUTH SURGERY    . TYMPANOSTOMY     Family History: family history includes Cancer in her maternal aunt, mother, and paternal grandfather; Diabetes in her paternal grandfather; Heart disease in her maternal grandfather and maternal grandmother; Hypertension in her maternal grandfather, maternal grandmother, and maternal uncle; Thyroid disease in her maternal grandmother. Social History:  reports that she has been smoking Cigarettes.  She has been smoking about 0.50 packs per day. She has never used smokeless tobacco. She reports that she uses drugs, including Benzodiazepines, Cocaine, Heroin, and Marijuana. She reports that she does not drink alcohol.     Maternal Diabetes: No Genetic Screening: Normal Maternal Ultrasounds/Referrals: Normal Fetal Ultrasounds or other Referrals:  None Maternal Substance Abuse:  Yes:  Type: Prescription drugs Significant Maternal Medications:  None Significant Maternal Lab Results:  None Other Comments:  None  ROS History Dilation: 10 Effacement (%): 100 Station: +1 Exam by:: KFields,rn Blood pressure (!) 124/59, pulse 83, temperature 98 F (36.7 Love), temperature source Oral, resp. rate 20, height 5\' 2"  (1.575 m), weight 147 lb (66.7 kg). Exam Physical Exam  Prenatal labs: ABO, Rh: --/--/A POS (10/14 1255) Antibody: PENDING (10/14 1255) Rubella: Immune (04/26 0000) RPR: Nonreactive (04/26 0000)  HBsAg: Negative (04/26 0000)  HIV: Non-reactive (04/26 0000)  GBS: Negative (09/08 0000)    Assessment/Plan: IUP at term Active labor Anticipate SVD   Kara Love 03/07/2016, 1:36 PM

## 2016-03-08 LAB — CBC
HEMATOCRIT: 31.3 % — AB (ref 36.0–46.0)
HEMOGLOBIN: 10.9 g/dL — AB (ref 12.0–15.0)
MCH: 31.6 pg (ref 26.0–34.0)
MCHC: 34.8 g/dL (ref 30.0–36.0)
MCV: 90.7 fL (ref 78.0–100.0)
Platelets: 243 10*3/uL (ref 150–400)
RBC: 3.45 MIL/uL — ABNORMAL LOW (ref 3.87–5.11)
RDW: 13.7 % (ref 11.5–15.5)
WBC: 20.7 10*3/uL — ABNORMAL HIGH (ref 4.0–10.5)

## 2016-03-08 LAB — RPR: RPR: NONREACTIVE

## 2016-03-08 NOTE — Progress Notes (Signed)
Post Partum Day 1 Subjective: no complaints, up ad lib, voiding and tolerating PO  Objective: Blood pressure (!) 101/47, pulse 61, temperature 97.6 F (36.4 C), temperature source Oral, resp. rate 17, height 5\' 2"  (1.575 m), weight 147 lb (66.7 kg), unknown if currently breastfeeding.  Physical Exam:  General: alert, cooperative, appears stated age and no distress Lochia: appropriate Uterine Fundus: firm Incision: healing well DVT Evaluation: No evidence of DVT seen on physical exam.   Recent Labs  03/07/16 1255 03/08/16 0551  HGB 13.1 10.9*  HCT 37.5 31.3*    Assessment/Plan: Plan for discharge tomorrow, Breastfeeding and Lactation consult   LOS: 1 day   Kara Love C 03/08/2016, 9:11 AM

## 2016-03-08 NOTE — Clinical Social Work Maternal (Signed)
  CLINICAL SOCIAL WORK MATERNAL/CHILD NOTE  Patient Details  Name: Kara Love MRN: [id number] Date of Birth: 03/07/2014  Date:  03/08/2016  Clinical Social Worker Initiating Note:  Malosi Hemstreet, MSW, LCSW-A Date/ Time Initiated:  03/08/16/1439     Child's Name:  Kara Love    Legal Guardian:  Other (Comment) (Not established by cout system. MOB and FOB parenting collectively. )   Need for Interpreter:  None   Date of Referral:  03/07/16     Reason for Referral:  Current Substance Use/Substance Use During Pregnancy    Referral Source:  Physician   Address:  [address] [place], [place] [address]  Phone number:  [phone]   Household Members:  Self, Parents   Natural Supports (not living in the home):  Immediate Family, Spouse/significant other   Professional Supports: Therapist, Organized support group (Comment) ([employer] )   Employment: Unemployed   Type of Work:     Education:  9 to 11 years   Financial Resources:  Private Insurance ([employer])   Other Resources:      Cultural/Religious Considerations Which May Impact Care:  None reported at this time.   Strengths:  Pediatrician chosen , Home prepared for child    Risk Factors/Current Problems:  Substance Use    Cognitive State:  Alert , Goal Oriented , Insightful    Mood/Affect:  Calm , Comfortable    CSW Assessment: CSW met with MOB at bedside to complete assessment. This writer explained role and reasoning for visit being due to her hx of substance use. This writer made MOB aware that she had a (+) UDS on 01/10/2016 for THC, Opiates, and Benzodiazepines. CSW informed MOB that we have a script for the opiates (percocet) on file; however, not the benzodiazepines. This writer further noted to MOB that her baby had a (+) UDS today, 03/08/2016 for THC. This writer explained hospitals policy and procedure regarding substance use. At this time, MOB notes she had a relapse in August where she  was using which explains her (+) UDS around that time. She further notes she expressed this to her OBGYN when it occurred. MOB further explains that she is actively seeing a therapist about her NA and she attends NA meetings. This writer affirmed MOB efforts towards recovery from her addiction. This writer explained to MOB that a CPS report will be made to [place] [employer] given baby's (+) UDS for THC. This writer informed MOB that a cord blood test is still pending and will go back three-months from the date it was taken. MOB verbalizes understanding.   No other needs addressed or requested. CSW will continue to follow pending cord blood test results.   **CPS report made to [place] [employer] after-hours worker Emily who notes someone will follow-up with week-day CSW tomorrow around 8AM**   CSW Plan/Description:  Child Protective Service Report     Tsering Leaman, MSW, LCSW-A Clinical Social Worker   [facility]  Office: [phone]   

## 2016-03-09 MED ORDER — IBUPROFEN 600 MG PO TABS: 600.0000 mg | ORAL_TABLET | Freq: Four times a day (QID) | ORAL | 0 refills | Status: DC

## 2016-03-09 MED ORDER — OXYCODONE-ACETAMINOPHEN 5-325 MG PO TABS: 1.0000 | ORAL_TABLET | ORAL | 0 refills | Status: DC | PRN

## 2016-03-09 MED ORDER — OXYCODONE-ACETAMINOPHEN 5-325 MG PO TABS: 1.0000 | ORAL_TABLET | Freq: Four times a day (QID) | ORAL | 0 refills | Status: DC | PRN

## 2016-03-09 NOTE — Discharge Summary (Signed)
Obstetric Discharge Summary Reason for Admission: onset of labor Prenatal Procedures: ultrasound Intrapartum Procedures: spontaneous vaginal delivery Postpartum Procedures: none Complications-Operative and Postpartum: 2 degree perineal laceration Hemoglobin  Date Value Ref Range Status  03/08/2016 10.9 (L) 12.0 - 15.0 g/dL Final   HCT  Date Value Ref Range Status  03/08/2016 31.3 (L) 36.0 - 46.0 % Final    Physical Exam:  General: alert and cooperative Lochia: appropriate Uterine Fundus: firm Incision: healing well DVT Evaluation: No evidence of DVT seen on physical exam. Negative Homan's sign. No cords or calf tenderness. No significant calf/ankle edema.  Discharge Diagnoses: Term Pregnancy-delivered  Discharge Information: Date: 03/09/2016 Activity: pelvic rest Diet: routine Medications: PNV, Ibuprofen and Percocet Condition: stable Instructions: refer to practice specific booklet Discharge to: home   Newborn Data: Live born female  Birth Weight: 5 lb 6.2 oz (2445 g) APGAR: 9, 9  Home with mother.  Tamie Minteer G 03/09/2016, 8:50 AM

## 2016-03-09 NOTE — Discharge Summary (Signed)
Addendum to discharge summary Patient given prescription for Percocet 5/325 #10 NR Previous prescription for Percocet 5/325 #20 was shredded

## 2016-03-11 ENCOUNTER — Inpatient Hospital Stay (HOSPITAL_COMMUNITY): Admission: RE | Admit: 2016-03-11 | Payer: No Typology Code available for payment source | Source: Ambulatory Visit

## 2016-08-29 ENCOUNTER — Emergency Department (HOSPITAL_COMMUNITY): Payer: Medicaid Other

## 2016-08-29 ENCOUNTER — Emergency Department (HOSPITAL_COMMUNITY)
Admission: EM | Admit: 2016-08-29 | Discharge: 2016-08-29 | Disposition: A | Discharge status: Home / Self Care | Payer: Medicaid Other | Attending: Emergency Medicine | Admitting: Emergency Medicine

## 2016-08-29 ENCOUNTER — Encounter (HOSPITAL_COMMUNITY): Payer: Self-pay | Admitting: *Deleted

## 2016-08-29 DIAGNOSIS — F17210 Nicotine dependence, cigarettes, uncomplicated: Secondary | ICD-10-CM | POA: Insufficient documentation

## 2016-08-29 DIAGNOSIS — N390 Urinary tract infection, site not specified: Secondary | ICD-10-CM

## 2016-08-29 DIAGNOSIS — J45909 Unspecified asthma, uncomplicated: Secondary | ICD-10-CM | POA: Diagnosis not present

## 2016-08-29 DIAGNOSIS — R0981 Nasal congestion: Secondary | ICD-10-CM | POA: Diagnosis present

## 2016-08-29 DIAGNOSIS — J069 Acute upper respiratory infection, unspecified: Secondary | ICD-10-CM | POA: Diagnosis not present

## 2016-08-29 LAB — URINALYSIS, ROUTINE W REFLEX MICROSCOPIC
Glucose, UA: NEGATIVE mg/dL
KETONES UR: 5 mg/dL — AB
Nitrite: NEGATIVE
PH: 5 (ref 5.0–8.0)
Protein, ur: 100 mg/dL — AB
Specific Gravity, Urine: 1.038 — ABNORMAL HIGH (ref 1.005–1.030)

## 2016-08-29 LAB — POC URINE PREG, ED: PREG TEST UR: NEGATIVE

## 2016-08-29 MED ORDER — ONDANSETRON HCL 4 MG PO TABS
4.0000 mg | ORAL_TABLET | Freq: Once | ORAL | Status: AC
  Administered 2016-08-29: 4 mg via ORAL
  Filled 2016-08-29: qty 1

## 2016-08-29 MED ORDER — ALBUTEROL SULFATE HFA 108 (90 BASE) MCG/ACT IN AERS
2.0000 | INHALATION_SPRAY | Freq: Once | RESPIRATORY_TRACT | Status: AC
  Administered 2016-08-29: 2 via RESPIRATORY_TRACT
  Filled 2016-08-29: qty 6.7

## 2016-08-29 MED ORDER — LEVOFLOXACIN 500 MG PO TABS: 500.0000 mg | ORAL_TABLET | Freq: Every day | ORAL | 0 refills | Status: DC

## 2016-08-29 MED ORDER — IPRATROPIUM-ALBUTEROL 0.5-2.5 (3) MG/3ML IN SOLN
3.0000 mL | Freq: Once | RESPIRATORY_TRACT | Status: AC
  Administered 2016-08-29: 3 mL via RESPIRATORY_TRACT
  Filled 2016-08-29: qty 3

## 2016-08-29 MED ORDER — LEVOFLOXACIN 500 MG PO TABS
500.0000 mg | ORAL_TABLET | Freq: Once | ORAL | Status: AC
  Administered 2016-08-29: 500 mg via ORAL
  Filled 2016-08-29: qty 1

## 2016-08-29 MED ORDER — DEXAMETHASONE SODIUM PHOSPHATE 4 MG/ML IJ SOLN
8.0000 mg | Freq: Once | INTRAMUSCULAR | Status: AC
  Administered 2016-08-29: 8 mg via INTRAMUSCULAR
  Filled 2016-08-29: qty 2

## 2016-08-29 MED ORDER — BENZONATATE 100 MG PO CAPS
200.0000 mg | ORAL_CAPSULE | Freq: Once | ORAL | Status: AC
  Administered 2016-08-29: 200 mg via ORAL
  Filled 2016-08-29: qty 2

## 2016-08-29 MED ORDER — BENZONATATE 100 MG PO CAPS: 200.0000 mg | ORAL_CAPSULE | Freq: Four times a day (QID) | ORAL | 0 refills | Status: DC | PRN

## 2016-08-29 MED ORDER — OXYMETAZOLINE HCL 0.05 % NA SOLN
2.0000 | Freq: Once | NASAL | Status: AC
  Administered 2016-08-29: 2 via NASAL
  Filled 2016-08-29: qty 15

## 2016-08-29 NOTE — ED Provider Notes (Signed)
[facility]Y DEPT Provider Note   CSN: [id number] Arrival date & time: 08/29/16  1859     History   Chief Complaint Chief Complaint  Patient presents with  . flu like symptoms    HPI Kara Love is a 26 y.o. female.   URI   This is a new problem. The current episode started 2 days ago. The problem has not changed since onset.Associated symptoms include congestion, ear pain, headaches, rhinorrhea, sneezing, sore throat and cough. Pertinent negatives include no chest pain, no abdominal pain, no diarrhea, no nausea, no vomiting, no dysuria, no neck pain, no rash and no wheezing. She has tried NSAIDs for the symptoms. The treatment provided mild relief.    Past Medical History:  Diagnosis Date  . Asthma   . Drug addiction (HCC)    currently using percocet  . Pregnant   . Scoliosis     Patient Active Problem List   Diagnosis Date Noted  . Abdominal pain 03/07/2016    Past Surgical History:  Procedure Laterality Date  . MOUTH SURGERY    . TYMPANOSTOMY      OB History    Gravida Para Term Preterm AB Living   SAB TAB Ectopic Multiple Live Births         0 1       Home Medications    Prior to Admission medications   Medication Sig Start Date End Date Taking? Authorizing Provider  ibuprofen (ADVIL,MOTRIN) 600 MG tablet Take 1 tablet (600 mg total) by mouth every 6 (six) hours. 03/09/16   Julio Sicks, NP  oxyCODONE-acetaminophen (PERCOCET/ROXICET) 5-325 MG tablet Take 1 tablet by mouth every 4 (four) hours as needed (pain scale 4-7). 03/09/16   Julio Sicks, NP  oxyCODONE-acetaminophen (ROXICET) 5-325 MG tablet Take 1-2 tablets by mouth every 6 (six) hours as needed for severe pain. 03/09/16   Harold Hedge, MD  Prenatal Vit-Fe Fumarate-FA (PRENATAL MULTIVITAMIN) TABS tablet Take 1 tablet by mouth daily.     Historical Provider, MD    Family History Family History  Problem Relation Age of Onset  . Cancer Mother     basal cell skin cancer  .  Cancer Maternal Aunt     basal cell skin cancer  . Hypertension Maternal Uncle   . Heart disease Maternal Grandmother   . Hypertension Maternal Grandmother   . Thyroid disease Maternal Grandmother   . Heart disease Maternal Grandfather   . Hypertension Maternal Grandfather   . Diabetes Paternal Grandfather   . Cancer Paternal Grandfather     prostate    Social History Social History  Substance Use Topics  . Smoking status: Current Every Day Smoker    Packs/day: 0.50    Types: Cigarettes  . Smokeless tobacco: Never Used  . Alcohol use No     Allergies   Cefzil [cefprozil]   Review of Systems Review of Systems  Constitutional: Positive for chills and fatigue. Negative for activity change.       All ROS Neg except as noted in HPI  HENT: Positive for congestion, ear pain, rhinorrhea, sneezing and sore throat. Negative for nosebleeds.   Eyes: Negative for photophobia and discharge.  Respiratory: Positive for cough. Negative for shortness of breath and wheezing.   Cardiovascular: Negative for chest pain and palpitations.  Gastrointestinal: Negative for abdominal pain, blood in stool, diarrhea, nausea and vomiting.  Genitourinary: Negative for dysuria, frequency and hematuria.  Musculoskeletal: Positive for  back pain. Negative for arthralgias and neck pain.  Skin: Negative.  Negative for rash.  Neurological: Positive for headaches. Negative for dizziness, seizures and speech difficulty.  Psychiatric/Behavioral: Negative for confusion and hallucinations.     Physical Exam Updated Vital Signs BP 120/71 (BP Location: Left Arm)   Pulse 90   Temp 99.4 F (37.4 C) (Oral)   Resp 20   Ht  (1.575 m)   Wt 54.4 kg   LMP 08/20/2016   SpO2 98%   BMI 21.95 kg/m   Physical Exam  Constitutional: She is oriented to person, place, and time. She appears well-developed and well-nourished.  Non-toxic appearance.  HENT:  Head: Normocephalic.  Right Ear: Tympanic membrane and  external ear normal.  Left Ear: Tympanic membrane and external ear normal.  Soreness over the sinuses. Congestion present. Mild fluid behind the TM's  Eyes: EOM and lids are normal. Pupils are equal, round, and reactive to light.  Neck: Normal range of motion. Neck supple. Carotid bruit is not present.  Cardiovascular: Normal rate, regular rhythm, normal heart sounds, intact distal pulses and normal pulses.   Pulmonary/Chest: No accessory muscle usage. No respiratory distress. She has wheezes. She has rhonchi.  Abdominal: Soft. Bowel sounds are normal. There is no tenderness. There is no guarding.  Musculoskeletal: Normal range of motion.  Lymphadenopathy:       Head (right side): No submandibular adenopathy present.       Head (left side): No submandibular adenopathy present.    She has no cervical adenopathy.  Neurological: She is alert and oriented to person, place, and time. She has normal strength. No cranial nerve deficit or sensory deficit.  Skin: Skin is warm and dry.  Psychiatric: She has a normal mood and affect. Her speech is normal.  Nursing note and vitals reviewed.    ED Treatments / Results  Labs (all labs ordered are listed, but only abnormal results are displayed) Labs Reviewed  URINALYSIS, ROUTINE W REFLEX MICROSCOPIC  POC URINE PREG, ED    EKG  EKG Interpretation None       Radiology No results found.  Procedures Procedures (including critical care time)  Medications Ordered in ED Medications - No data to display   Initial Impression / Assessment and Plan / ED Course  I have reviewed the triage vital signs and the nursing notes.  Pertinent labs & imaging results that were available during my care of the patient were reviewed by me and considered in my medical decision making (see chart for details).      Final Clinical Impressions(s) / ED Diagnoses MDM Vital signs reviewed. Urinalysis shows a cloudy amber specimen with a specific gravity of  1.038. There is a small amount of hemoglobin present. There is 100 mg/daL of protein present. Leukocyte esterases are small. There are too many to count white blood cells. The urine pregnancy test is negative. Chest x-ray is negative for acute process.  A culture of the urine has been sent to the lab. Patient will be treated with Levaquin 1 daily for the next 5 days. Patient is to have the urine rechecked in 5-7 days. She will return to the emergency department if any changes, problems, or concerns.    Final diagnoses:  Urinary tract infection without hematuria, site unspecified  Upper respiratory tract infection, unspecified type    New Prescriptions New Prescriptions   No medications on file     Ivery Quale, PA-C 08/30/16 1627    Bethann Berkshire, MD  08/30/16 2323  

## 2016-08-29 NOTE — ED Notes (Signed)
Pt was not able to sign discharge, it was broke

## 2016-08-29 NOTE — Discharge Instructions (Signed)
Your chest x-ray is negative for pneumonia or other acute problem. Your urine tests suggest a urinary tract infection. Please use Levaquin daily with food. Please use Tessalon pearls every 6 hours as needed for cough and congestion. Use Afrin spray every 12 hours for 4 days only. And use albuterol 2 puffs every 4 hours as needed for cough or difficulty with breathing. Please wash hands frequently.

## 2016-08-29 NOTE — ED Triage Notes (Signed)
Pt reports cough x 2 days with back pain due to coughing. Pt reports generalized body aches, sore throat and fever starting yesterday. Fever at home 101.0. Pt took 2 ibuprofen at around 4 am. Pt c/o more aching in bilateral biceps.

## 2016-09-01 LAB — URINE CULTURE: Culture: NO GROWTH

## 2017-05-27 ENCOUNTER — Ambulatory Visit (HOSPITAL_COMMUNITY)
Admission: RE | Admit: 2017-05-27 | Discharge: 2017-05-27 | Disposition: A | Discharge status: Home / Self Care | Payer: Medicaid Other | Attending: Psychiatry | Admitting: Psychiatry

## 2017-05-27 NOTE — BH Assessment (Signed)
Assessment Note  Kara Love is an 27 y.o. female. Pt denies SI/HI and AVH. Pt reports increased anxiety. Pt states she was prescribed anxiety medication from a Suboxone clinic but they stopped giving her the medication because she stopped taking Suboxone. The Pt states she was prescribed Seroquel and Baclofen. Pt states she was previously addicted to Xanax but has been clean for 2 years.  Shuvon, NP recommends D/C and follow-up with outpatient providers. Pt provided her with outpatient resources.   Diagnosis:  F41.1 GAD  Past Medical History:  Past Medical History:  Diagnosis Date  . Asthma   . Drug addiction (HCC)    currently using percocet  . Pregnant   . Scoliosis     Past Surgical History:  Procedure Laterality Date  . MOUTH SURGERY    . TYMPANOSTOMY      Family History:  Family History  Problem Relation Age of Onset  . Cancer Mother        basal cell skin cancer  . Cancer Maternal Aunt        basal cell skin cancer  . Hypertension Maternal Uncle   . Heart disease Maternal Grandmother   . Hypertension Maternal Grandmother   . Thyroid disease Maternal Grandmother   . Heart disease Maternal Grandfather   . Hypertension Maternal Grandfather   . Diabetes Paternal Grandfather   . Cancer Paternal Grandfather        prostate    Social History:  reports that she has been smoking cigarettes.  She has been smoking about 0.50 packs per day. she has never used smokeless tobacco. She reports that she does not drink alcohol or use drugs.  Additional Social History:  Alcohol / Drug Use Pain Medications: please see mar Prescriptions: please see mar Over the Counter: please see mar History of alcohol / drug use?: Yes Longest period of sobriety (when/how long): 2 years  Negative Consequences of Use: Financial, Armed forces operational officerLegal, Work / Programmer, multimediachool, Personal relationships Substance #1 Name of Substance 1: opiates (prescription drugs) 1 - Age of First Use: unknown 1 - Amount (size/oz):  unknown 1 - Frequency: none 1 - Duration: stopped 2 years ago 1 - Last Use / Amount: stopped 2 years ago  CIWA: CIWA-Ar BP: (!) 121/58 Pulse Rate: 98 COWS:    Allergies:  Allergies  Allergen Reactions  . Cefzil [Cefprozil] Rash    Home Medications:  (Not in a hospital admission)  OB/GYN Status:  No LMP recorded.  General Assessment Data [facility] TTS Assessment: In system Is this a Tele or Face-to-Face Assessment?: Face-to-Face Is this an Initial Assessment or a Re-assessment for this encounter?: Initial Assessment Marital status: Single Maiden name: [name] Is patient pregnant?: No Pregnancy Status: No Living Arrangements: Alone Can pt return to current living arrangement?: Yes Admission Status: Voluntary Is patient capable of signing voluntary admission?: Yes Referral Source: Self/Family/Friend Insurance type: Medicaid  Medical Screening Exam [facility] Walk[facility] ONLY) Medical Exam completed: Yes  Crisis Care Plan Living Arrangements: Alone Legal Guardian: Other:(self) Name of Psychiatrist: NA Name of Therapist: NA  Education Status Is patient currently in school?: No Current Grade: na Highest grade of school patient has completed: 12 Name of school: NA Contact person: NA  Risk to self with the past 6 months Suicidal Ideation: No Has patient been a risk to self within the past 6 months prior to admission? : No Suicidal Intent: No Has patient had any suicidal intent within the past 6 months prior to  admission? : No Is patient at risk for suicide?: No Suicidal Plan?: No-Not Currently/Within Last 6 Months Has patient had any suicidal plan within the past 6 months prior to admission? : No Access to Means: No What has been your use of drugs/alcohol within the last 12 months?: 2 years ago opiate abuse Previous Attempts/Gestures: No How many times?: 0 Other Self Harm Risks: NA Triggers for Past Attempts: None known Intentional  Self Injurious Behavior: None Family Suicide History: No Recent stressful life event(s): Other (Comment)(stopped Suboxone) Persecutory voices/beliefs?: No Depression: No Depression Symptoms: (pt denies) Substance abuse history and/or treatment for substance abuse?: Yes Suicide prevention information given to non-admitted patients: Not applicable  Risk to Others within the past 6 months Homicidal Ideation: No Does patient have any lifetime risk of violence toward others beyond the six months prior to admission? : No Thoughts of Harm to Others: No Current Homicidal Intent: No Current Homicidal Plan: No Access to Homicidal Means: No Identified Victim: na History of harm to others?: No Assessment of Violence: None Noted Violent Behavior Description: na Does patient have access to weapons?: No Criminal Charges Pending?: No Does patient have a court date: No Is patient on probation?: No  Psychosis Hallucinations: None noted Delusions: None noted  Mental Status Report Appearance/Hygiene: Unremarkable Eye Contact: Good Motor Activity: Freedom of movement Speech: Logical/coherent Level of Consciousness: Alert Mood: Euthymic Affect: Appropriate to circumstance Anxiety Level: Moderate Thought Processes: Coherent, Relevant Judgement: Unimpaired Orientation: Person, Place, Time, Situation Obsessive Compulsive Thoughts/Behaviors: None  Cognitive Functioning Concentration: Normal Memory: Recent Intact, Remote Intact IQ: Average Insight: Good Impulse Control: Good Appetite: Good Weight Loss: 0 Weight Gain: 0 Sleep: Decreased Total Hours of Sleep: 6 Vegetative Symptoms: None  ADLScreening [facility] Assessment Services) Patient's cognitive ability adequate to safely complete daily activities?: Yes Patient able to express need for assistance with ADLs?: Yes Independently performs ADLs?: Yes (appropriate for developmental age)  Prior Inpatient Therapy Prior Inpatient Therapy:  Yes Prior Therapy Dates: 2017 Prior Therapy Facilty/Provider(s): [facility] Reason for Treatment: SA  Prior Outpatient Therapy Prior Outpatient Therapy: No Prior Therapy Dates: NA Prior Therapy Facilty/Provider(s): NA Reason for Treatment: NA Does patient have an ACCT team?: No Does patient have Intensive In-House Services?  : No Does patient have [facility] services? : No Does patient have P4CC services?: No  ADL Screening (condition at time of admission) Patient's cognitive ability adequate to safely complete daily activities?: Yes Is the patient deaf or have difficulty hearing?: No Does the patient have difficulty seeing, even when wearing glasses/contacts?: No Does the patient have difficulty concentrating, remembering, or making decisions?: No Patient able to express need for assistance with ADLs?: Yes Does the patient have difficulty dressing or bathing?: No Independently performs ADLs?: Yes (appropriate for developmental age) Does the patient have difficulty walking or climbing stairs?: No Weakness of Legs: None Weakness of Arms/Hands: None       Abuse/Neglect Assessment (Assessment to be complete while patient is alone) Abuse/Neglect Assessment Can Be Completed: Yes Physical Abuse: Denies Verbal Abuse: Denies Sexual Abuse: Denies Exploitation of patient/patient's resources: Denies     Merchant navy officer (For Healthcare) Does Patient Have a Medical Advance Directive?: No Would patient like information on creating a medical advance directive?: No - Patient declined    Additional Information 1:1 In Past 12 Months?: No CIRT Risk: No Elopement Risk: No Does patient have medical clearance?: Yes     Disposition:  Disposition Initial Assessment Completed for this Encounter: Yes Disposition of Patient: Discharge  with Outpatient Resources  On Site Evaluation by:   Reviewed with Physician:    Emmit Pomfret 05/27/2017 5:13 PM

## 2017-05-27 NOTE — H&P (Signed)
Behavioral Health Medical Screening Exam  Kara Love is an 27 y.o. female patient [facility] with complaints of anxiety.  Patient states she was taking Seroquel for anxiety when she was receiving services at the Suboxone clinic but when stopped going to the clinic physician no longer wrote prescription for Seroquel.  Patient denies suicidal/homicidal/self-harm ideation, psychosis, and paranoia.   Total Time spent with patient: 45 minutes  Psychiatric Specialty Exam: Physical Exam  Constitutional: She is oriented to person, place, and time. She appears well-developed.  HENT:  Head: Normocephalic.  Neck: Normal range of motion.  Cardiovascular: Normal rate and regular rhythm.  Respiratory: Effort normal and breath sounds normal.  Musculoskeletal: Normal range of motion.  Neurological: She is alert and oriented to person, place, and time.  Skin: Skin is warm and dry.    Review of Systems  Psychiatric/Behavioral: Depression: Denies. Hallucinations: Denies. Substance abuse: History of opiate use; but denies at this time. Suicidal ideas: Denies. The patient is nervous/anxious. Insomnia: Denies.     Blood pressure (!) 121/58, pulse 98, temperature 98.1 F (36.7 C), resp. rate 16, SpO2 100 %, unknown if currently breastfeeding.There is no height or weight on file to calculate BMI.  General Appearance: Casual and Neat  Eye Contact:  Good  Speech:  Clear and Coherent and Normal Rate  Volume:  Normal  Mood:  Anxious  Affect:  Appropriate and Congruent  Thought Process:  Coherent and Goal Directed  Orientation:  Full (Time, Place, and Person)  Thought Content:  Logical  Suicidal Thoughts:  No  Homicidal Thoughts:  No  Memory:  Immediate;   Good Recent;   Good Remote;   Good  Judgement:  Intact  Insight:  Good and Present  Psychomotor Activity:  Normal  Concentration: Concentration: Good and Attention Span: Good  Recall:  Good  Fund of Knowledge:Good   Language: Good  Akathisia:  No  Handed:  Right  AIMS (if indicated):     Assets:  Communication Skills Desire for Improvement Housing Resilience Social Support  Sleep:       Musculoskeletal: Strength & Muscle Tone: within normal limits Gait & Station: normal Patient leans: N/A  Blood pressure (!) 121/58, pulse 98, temperature 98.1 F (36.7 C), resp. rate 16, SpO2 100 %, unknown if currently breastfeeding.  Recommendations:  Referral to primary physician and Referral and resource information given for outpatient psychiatric services.  Based on my evaluation the patient does not appear to have an emergency medical condition.  Naela Nodal, NP 05/27/2017, 5:02 PM

## 2017-06-01 ENCOUNTER — Emergency Department (HOSPITAL_COMMUNITY): Payer: Medicaid Other

## 2017-06-01 ENCOUNTER — Emergency Department (HOSPITAL_COMMUNITY)
Admission: EM | Admit: 2017-06-01 | Discharge: 2017-06-01 | Disposition: A | Discharge status: Home / Self Care | Payer: Medicaid Other | Attending: Emergency Medicine | Admitting: Emergency Medicine

## 2017-06-01 ENCOUNTER — Encounter (HOSPITAL_COMMUNITY): Payer: Self-pay | Admitting: *Deleted

## 2017-06-01 DIAGNOSIS — Y92008 Other place in unspecified non-institutional (private) residence as the place of occurrence of the external cause: Secondary | ICD-10-CM | POA: Diagnosis not present

## 2017-06-01 DIAGNOSIS — F17210 Nicotine dependence, cigarettes, uncomplicated: Secondary | ICD-10-CM | POA: Insufficient documentation

## 2017-06-01 DIAGNOSIS — W1789XA Other fall from one level to another, initial encounter: Secondary | ICD-10-CM | POA: Diagnosis not present

## 2017-06-01 DIAGNOSIS — Y9389 Activity, other specified: Secondary | ICD-10-CM | POA: Diagnosis not present

## 2017-06-01 DIAGNOSIS — Y999 Unspecified external cause status: Secondary | ICD-10-CM | POA: Diagnosis not present

## 2017-06-01 DIAGNOSIS — S99911A Unspecified injury of right ankle, initial encounter: Secondary | ICD-10-CM | POA: Diagnosis present

## 2017-06-01 DIAGNOSIS — S93401A Sprain of unspecified ligament of right ankle, initial encounter: Secondary | ICD-10-CM | POA: Insufficient documentation

## 2017-06-01 DIAGNOSIS — Z79899 Other long term (current) drug therapy: Secondary | ICD-10-CM | POA: Insufficient documentation

## 2017-06-01 DIAGNOSIS — J45909 Unspecified asthma, uncomplicated: Secondary | ICD-10-CM | POA: Diagnosis not present

## 2017-06-01 MED ORDER — IBUPROFEN 800 MG PO TABS: 800.0000 mg | ORAL_TABLET | Freq: Three times a day (TID) | ORAL | 0 refills | Status: DC

## 2017-06-01 MED ORDER — IBUPROFEN 800 MG PO TABS
800.0000 mg | ORAL_TABLET | Freq: Once | ORAL | Status: AC
  Administered 2017-06-01: 800 mg via ORAL
  Filled 2017-06-01: qty 1

## 2017-06-01 NOTE — ED Notes (Signed)
Pt alert & oriented x4. Patient given discharge instructions, paperwork & prescription(s). Patient verbalized understanding. Pt left department in wheelchair escorted by staff. Pt left w/ no further questions. 

## 2017-06-01 NOTE — Discharge Instructions (Signed)
Wear the ASO and use crutches to avoid weight bearing.  Use ice and elevation as much as possible for the next several days to help reduce the swelling.  Take ibuprofen for pain and inflammation.    You may benefit from physical therapy of your ankle if it is not getting better over the next week.

## 2017-06-01 NOTE — ED Provider Notes (Signed)
[facility] EMERGENCY DEPARTMENT Provider Note   CSN: [id number] Arrival date & time: 06/01/17  1832     History   Chief Complaint Chief Complaint  Patient presents with  . Foot Pain    HPI Kara Love is a 27 y.o. female presenting with right ankle pain and swelling which occurred suddenly when the patient tripped on a step and fell off her porch landing with her right foot underneath her against pavement.  Pain is aching, constant and worse with palpation, movement and weight bearing.  The patient was unable to weight bear immediately after the event.  There is no radiation of pain and the patient denies numbness distal to the injury site.  The patients treatment prior to arrival included tylenol. Marland Kitchen  HPI  Past Medical History:  Diagnosis Date  . Asthma   . Drug addiction (HCC)    currently using percocet  . Pregnant   . Scoliosis     Patient Active Problem List   Diagnosis Date Noted  . Abdominal pain 03/07/2016    Past Surgical History:  Procedure Laterality Date  . MOUTH SURGERY    . TYMPANOSTOMY      OB History    Gravida Para Term Preterm AB Living   1 1 1     1    SAB TAB Ectopic Multiple Live Births         0 1       Home Medications    Prior to Admission medications   Medication Sig Start Date End Date Taking? Authorizing Provider  benzonatate (TESSALON) 100 MG capsule Take 2 capsules (200 mg total) by mouth every 6 (six) hours as needed for cough. 08/29/16   Ivery Quale, PA-C  ibuprofen (ADVIL,MOTRIN) 800 MG tablet Take 1 tablet (800 mg total) by mouth 3 (three) times daily. 06/01/17   Burgess Amor, PA-C  levofloxacin (LEVAQUIN) 500 MG tablet Take 1 tablet (500 mg total) by mouth daily. Take with food 08/29/16   Ivery Quale, PA-C  oxyCODONE-acetaminophen (PERCOCET/ROXICET) 5-325 MG tablet Take 1 tablet by mouth every 4 (four) hours as needed (pain scale 4-7). 03/09/16   Julio Sicks, NP  oxyCODONE-acetaminophen (ROXICET) 5-325 MG tablet Take 1-2  tablets by mouth every 6 (six) hours as needed for severe pain. 03/09/16   Harold Hedge, MD  Prenatal Vit-Fe Fumarate-FA (PRENATAL MULTIVITAMIN) TABS tablet Take 1 tablet by mouth daily.     [provider]    Family History Family History  Problem Relation Age of Onset  . Cancer Mother        basal cell skin cancer  . Cancer Maternal Aunt        basal cell skin cancer  . Hypertension Maternal Uncle   . Heart disease Maternal Grandmother   . Hypertension Maternal Grandmother   . Thyroid disease Maternal Grandmother   . Heart disease Maternal Grandfather   . Hypertension Maternal Grandfather   . Diabetes Paternal Grandfather   . Cancer Paternal Grandfather        prostate    Social History Social History   Tobacco Use  . Smoking status: Current Every Day Smoker    Packs/day: 0.50    Types: Cigarettes  . Smokeless tobacco: Never Used  Substance Use Topics  . Alcohol use: No  . Drug use: No    Comment: stopped THC 3 wks ago, denies heroin or cocaine during pregnancy     Allergies   Cefzil [cefprozil]   Review of Systems Review  of Systems  Musculoskeletal: Positive for arthralgias and joint swelling.  Skin: Negative for wound.  Neurological: Negative for weakness and numbness.     Physical Exam Updated Vital Signs BP 129/66 (BP Location: Right Arm)   Pulse (!) 112   Temp 98.4 F (36.9 C) (Oral)   Resp 18   Ht 5\' 2"  (1.575 m)   Wt 56.7 kg (125 lb)   LMP 05/18/2017   SpO2 96%   BMI 22.86 kg/m   Physical Exam  Constitutional: She appears well-developed and well-nourished.  HENT:  Head: Normocephalic.  Cardiovascular: Normal rate and intact distal pulses. Exam reveals no decreased pulses.  Pulses:      Dorsalis pedis pulses are 2+ on the right side, and 2+ on the left side.       Posterior tibial pulses are 2+ on the right side, and 2+ on the left side.  Musculoskeletal: She exhibits edema and tenderness.       Right ankle: She exhibits  decreased range of motion and swelling. She exhibits no ecchymosis and normal pulse. Tenderness. Lateral malleolus and medial malleolus tenderness found. No head of 5th metatarsal and no proximal fibula tenderness found. Achilles tendon normal. Achilles tendon exhibits no defect.  Neurological: She is alert. No sensory deficit.  Skin: Skin is warm, dry and intact.  Nursing note and vitals reviewed.    ED Treatments / Results  Labs (all labs ordered are listed, but only abnormal results are displayed) Labs Reviewed - No data to display  EKG  EKG Interpretation None       Radiology Dg Ankle Complete Right  Result Date: 06/01/2017 CLINICAL DATA:  Right foot and ankle injury today when the patient tripped on steps. Initial encounter. EXAM: RIGHT ANKLE - COMPLETE 3+ VIEW COMPARISON:  None. FINDINGS: There is no evidence of fracture, dislocation, or joint effusion. There is no evidence of arthropathy or other focal bone abnormality. Soft tissues are unremarkable. IMPRESSION: Normal exam. Electronically Signed   By: Drusilla Kannerhomas  Dalessio M.D.   On: 06/01/2017 19:29   Dg Foot Complete Right  Result Date: 06/01/2017 CLINICAL DATA:  Right foot and ankle injury today when the patient tripped on steps. Initial encounter. EXAM: RIGHT FOOT COMPLETE - 3+ VIEW COMPARISON:  None. FINDINGS: There is no evidence of fracture or dislocation. There is no evidence of arthropathy or other focal bone abnormality. Soft tissues are unremarkable. IMPRESSION: Normal exam. Electronically Signed   By: Drusilla Kannerhomas  Dalessio M.D.   On: 06/01/2017 19:28    Procedures Procedures (including critical care time)  Medications Ordered in ED Medications  ibuprofen (ADVIL,MOTRIN) tablet 800 mg (800 mg Oral Given 06/01/17 2030)     Initial Impression / Assessment and Plan / ED Course  I have reviewed the triage vital signs and the nursing notes.  Pertinent labs & imaging results that were available during my care of the patient  were reviewed by me and considered in my medical decision making (see chart for details).     RICE,aso, crutches. Prn f/u if not improving over the next week, discussed injury may take weeks to heal, should wear aso until pain free.  Advised recheck if not able to stop using crutches after one week without increased pain or swelling.   Final Clinical Impressions(s) / ED Diagnoses   Final diagnoses:  Sprain of right ankle, unspecified ligament, initial encounter    ED Discharge Orders        Ordered    ibuprofen (ADVIL,MOTRIN) 800 MG  tablet  3 times daily     06/01/17 2030       Victoriano Lain 06/01/17 2032    Loren Racer, MD 06/02/17 1524

## 2017-06-01 NOTE — ED Triage Notes (Signed)
Right foot injury an hour ago, fell off porch. Unable to put weight on foot.

## 2017-06-22 ENCOUNTER — Encounter (HOSPITAL_COMMUNITY): Payer: Self-pay | Admitting: Emergency Medicine

## 2017-06-22 ENCOUNTER — Emergency Department (HOSPITAL_COMMUNITY)
Admission: EM | Admit: 2017-06-22 | Discharge: 2017-06-22 | Disposition: A | Discharge status: Home / Self Care | Payer: Medicaid Other | Attending: Emergency Medicine | Admitting: Emergency Medicine

## 2017-06-22 ENCOUNTER — Other Ambulatory Visit: Payer: Self-pay

## 2017-06-22 DIAGNOSIS — F329 Major depressive disorder, single episode, unspecified: Secondary | ICD-10-CM | POA: Insufficient documentation

## 2017-06-22 DIAGNOSIS — F1910 Other psychoactive substance abuse, uncomplicated: Secondary | ICD-10-CM | POA: Insufficient documentation

## 2017-06-22 DIAGNOSIS — F17210 Nicotine dependence, cigarettes, uncomplicated: Secondary | ICD-10-CM | POA: Diagnosis not present

## 2017-06-22 DIAGNOSIS — F419 Anxiety disorder, unspecified: Secondary | ICD-10-CM | POA: Insufficient documentation

## 2017-06-22 DIAGNOSIS — Z720 Tobacco use: Secondary | ICD-10-CM

## 2017-06-22 DIAGNOSIS — F32A Depression, unspecified: Secondary | ICD-10-CM

## 2017-06-22 DIAGNOSIS — R440 Auditory hallucinations: Secondary | ICD-10-CM | POA: Insufficient documentation

## 2017-06-22 DIAGNOSIS — J45909 Unspecified asthma, uncomplicated: Secondary | ICD-10-CM | POA: Diagnosis not present

## 2017-06-22 DIAGNOSIS — Z760 Encounter for issue of repeat prescription: Secondary | ICD-10-CM | POA: Insufficient documentation

## 2017-06-22 HISTORY — DX: Schizophrenia, unspecified: F20.9

## 2017-06-22 HISTORY — DX: Bipolar disorder, unspecified: F31.9

## 2017-06-22 LAB — COMPREHENSIVE METABOLIC PANEL
ALBUMIN: 4.1 g/dL (ref 3.5–5.0)
ALK PHOS: 65 U/L (ref 38–126)
ALT: 16 U/L (ref 14–54)
AST: 18 U/L (ref 15–41)
Anion gap: 8 (ref 5–15)
BILIRUBIN TOTAL: 0.6 mg/dL (ref 0.3–1.2)
BUN: 15 mg/dL (ref 6–20)
CALCIUM: 9.2 mg/dL (ref 8.9–10.3)
CO2: 24 mmol/L (ref 22–32)
Chloride: 105 mmol/L (ref 101–111)
Creatinine, Ser: 0.59 mg/dL (ref 0.44–1.00)
GFR calc Af Amer: >60 mL/min (ref >60)
GFR calc non Af Amer: >60 mL/min (ref >60)
GLUCOSE: 113 mg/dL — AB (ref 65–99)
Potassium: 3.9 mmol/L (ref 3.5–5.1)
Sodium: 137 mmol/L (ref 135–145)
TOTAL PROTEIN: 7.7 g/dL (ref 6.5–8.1)

## 2017-06-22 LAB — CBC
HEMATOCRIT: 41 % (ref 36.0–46.0)
Hemoglobin: 14.1 g/dL (ref 12.0–15.0)
MCH: 31.6 pg (ref 26.0–34.0)
MCHC: 34.4 g/dL (ref 30.0–36.0)
MCV: 91.9 fL (ref 78.0–100.0)
Platelets: 381 10*3/uL (ref 150–400)
RBC: 4.46 MIL/uL (ref 3.87–5.11)
RDW: 13 % (ref 11.5–15.5)
WBC: 13.5 10*3/uL — ABNORMAL HIGH (ref 4.0–10.5)

## 2017-06-22 LAB — SALICYLATE LEVEL: Salicylate Lvl: <7 mg/dL (ref 2.8–30.0)

## 2017-06-22 LAB — I-STAT BETA HCG BLOOD, ED (MC, WL, AP ONLY): I-stat hCG, quantitative: <5 m[IU]/mL (ref <5)

## 2017-06-22 LAB — ETHANOL: Alcohol, Ethyl (B): <10 mg/dL (ref <10)

## 2017-06-22 LAB — ACETAMINOPHEN LEVEL: Acetaminophen (Tylenol), Serum: <10 ug/mL — ABNORMAL LOW (ref 10–30)

## 2017-06-22 NOTE — ED Provider Notes (Signed)
[facility]-EMERGENCY DEPT Provider Note   CSN: [id number] Arrival date & time: 06/22/17  1941     History   Chief Complaint Chief Complaint  Patient presents with  . Medical Clearance    HPI Kara Love is a 27 y.o. female with a PMHx of asthma, drug addition, manic depression, schizophrenia, and scoliosis, who presents to the ED accompanied by her aunt, however evaluation was performed with aunt out of room, with complaints of wanting to be restarted on her psych meds.  Patient states that she has been off of her antidepressant (unsure name of medication), Seroquel, baclofen, Antabuse, and diazepam for the last 3 months after being dismissed by her psychiatrist and being told to follow-up with a PCP but she has not followed up.  She does not currently have a therapist.  She states that when she was on her medications, she was doing well, however since coming off of her medications that she has had worsening anxiety episodes, depression, and will occasionally have episodes where she has racing thoughts and will hear voices telling her that she is worthless.  She has been using cocaine, marijuana, and illicit Xanax in order to self medicate, last use of all these was yesterday.  She is requesting referrals to outpatient psychiatry or therapy in order to get restarted on her medications, and is not interested in staying tonight because her mother is in the hospital and is scheduled to be discharged tomorrow and she wants to be home for when her mother gets home.  She states she's willing to come back tomorrow if she needed to stay, but she doesn't want to stay tonight.  She denies SI, HI, visual hallucinations, or alcohol use.  She admits to being a cigarette smoker.  She denies any other medical complaints at this time and is here voluntarily.     The history is provided by the patient and medical records. No language interpreter was used.  Mental Health Problem    Presenting symptoms: depression and hallucinations   Presenting symptoms: no homicidal ideas and no suicidal thoughts   Patient accompanied by:  Family member Onset quality:  Gradual Duration:  3 months Timing:  Constant Progression:  Unchanged Chronicity:  Recurrent Context: drug abuse and noncompliance   Treatment compliance:  None of the time Time since last psychoactive medication taken:  3 months Relieved by:  None tried Worsened by:  Nothing Ineffective treatments:  None tried Associated symptoms: anxiety   Associated symptoms: no abdominal pain and no chest pain   Risk factors: hx of mental illness   Risk factors: no recent psychiatric admission     Past Medical History:  Diagnosis Date  . Asthma   . Drug addiction (HCC)    currently using percocet  . Manic depressive disorder (HCC)   . Pregnant   . Schizophrenia (HCC)   . Scoliosis     Patient Active Problem List   Diagnosis Date Noted  . Abdominal pain 03/07/2016    Past Surgical History:  Procedure Laterality Date  . MOUTH SURGERY    . TYMPANOSTOMY      OB History    Gravida Para Term Preterm AB Living   1 1 1     1    SAB TAB Ectopic Multiple Live Births         0 1       Home Medications    Prior to Admission medications   Medication Sig Start Date End Date  Taking? Authorizing Provider  levonorgestrel (MIRENA, 52 MG,) 20 MCG/24HR IUD 1 each by Intrauterine route once.   Yes [provider]  benzonatate (TESSALON) 100 MG capsule Take 2 capsules (200 mg total) by mouth every 6 (six) hours as needed for cough. Patient not taking: Reported on 06/22/2017 08/29/16   Ivery Quale, PA-C  ibuprofen (ADVIL,MOTRIN) 800 MG tablet Take 1 tablet (800 mg total) by mouth 3 (three) times daily. Patient not taking: Reported on 06/22/2017 06/01/17   Burgess Amor, PA-C  levofloxacin (LEVAQUIN) 500 MG tablet Take 1 tablet (500 mg total) by mouth daily. Take with food Patient not taking: Reported on 06/22/2017  08/29/16   Ivery Quale, PA-C  oxyCODONE-acetaminophen (PERCOCET/ROXICET) 5-325 MG tablet Take 1 tablet by mouth every 4 (four) hours as needed (pain scale 4-7). Patient not taking: Reported on 06/22/2017 03/09/16   Julio Sicks, NP  oxyCODONE-acetaminophen (ROXICET) 5-325 MG tablet Take 1-2 tablets by mouth every 6 (six) hours as needed for severe pain. Patient not taking: Reported on 06/22/2017 03/09/16   Harold Hedge, MD    Family History Family History  Problem Relation Age of Onset  . Cancer Mother        basal cell skin cancer  . Cancer Maternal Aunt        basal cell skin cancer  . Hypertension Maternal Uncle   . Heart disease Maternal Grandmother   . Hypertension Maternal Grandmother   . Thyroid disease Maternal Grandmother   . Heart disease Maternal Grandfather   . Hypertension Maternal Grandfather   . Diabetes Paternal Grandfather   . Cancer Paternal Grandfather        prostate    Social History Social History   Tobacco Use  . Smoking status: Current Every Day Smoker    Packs/day: 0.50    Types: Cigarettes  . Smokeless tobacco: Never Used  Substance Use Topics  . Alcohol use: No  . Drug use: Yes    Types: Benzodiazepines, Cocaine, Heroin, Marijuana    Comment: stopped THC 3 wks ago, denies heroin or cocaine during pregnancy     Allergies   Cefzil [cefprozil]   Review of Systems Review of Systems  Constitutional: Negative for chills and fever.  Respiratory: Negative for shortness of breath.   Cardiovascular: Negative for chest pain.  Gastrointestinal: Negative for abdominal pain, constipation, diarrhea, nausea and vomiting.  Genitourinary: Negative for dysuria and hematuria.  Musculoskeletal: Negative for arthralgias and myalgias.  Skin: Negative for color change.  Allergic/Immunologic: Negative for immunocompromised state.  Neurological: Negative for weakness and numbness.  Psychiatric/Behavioral: Positive for hallucinations. Negative for confusion,  homicidal ideas and suicidal ideas. The patient is nervous/anxious.    All other systems reviewed and are negative for acute change except as noted in the HPI.    Physical Exam Updated Vital Signs BP 123/76 (BP Location: Left Arm)   Pulse (!) 101   Temp 98.3 F (36.8 C) (Oral)   Resp 16   Ht 5\' 2"  (1.575 m)   Wt 56.7 kg (125 lb)   LMP 06/13/2017 (Approximate)   SpO2 98%   BMI 22.86 kg/m   Physical Exam  Constitutional: She is oriented to person, place, and time. Vital signs are normal. She appears well-developed and well-nourished.  Non-toxic appearance. No distress.  Afebrile, nontoxic, NAD  HENT:  Head: Normocephalic and atraumatic.  Mouth/Throat: Oropharynx is clear and moist and mucous membranes are normal.  Eyes: Conjunctivae and EOM are normal. Right eye exhibits no discharge. Left eye exhibits  no discharge.  Neck: Normal range of motion. Neck supple.  Cardiovascular: Normal rate, regular rhythm, normal heart sounds and intact distal pulses. Exam reveals no gallop and no friction rub.  No murmur heard. HR 90s during exam  Pulmonary/Chest: Effort normal and breath sounds normal. No respiratory distress. She has no decreased breath sounds. She has no wheezes. She has no rhonchi. She has no rales.  Abdominal: Soft. Normal appearance and bowel sounds are normal. She exhibits no distension. There is no tenderness. There is no rigidity, no rebound, no guarding, no CVA tenderness, no tenderness at McBurney's point and negative Murphy's sign.  Musculoskeletal: Normal range of motion.  Neurological: She is alert and oriented to person, place, and time. She has normal strength. No sensory deficit.  Skin: Skin is warm, dry and intact. No rash noted.  Psychiatric: Her affect is blunt. She is not actively hallucinating. She expresses no homicidal and no suicidal ideation. She expresses no suicidal plans and no homicidal plans.  Somewhat flat affect, but pleasant and cooperative. Denies  SI, HI, or visual hallucinations, reports occasional auditory hallucinations but doesn't seem to be responding to internal stimuli.   Nursing note and vitals reviewed.    ED Treatments / Results  Labs (all labs ordered are listed, but only abnormal results are displayed) Labs Reviewed  COMPREHENSIVE METABOLIC PANEL - Abnormal; Notable for the following components:      Result Value   Glucose, Bld 113 (*)    All other components within normal limits  ACETAMINOPHEN LEVEL - Abnormal; Notable for the following components:   Acetaminophen (Tylenol), Serum <10 (*)    All other components within normal limits  CBC - Abnormal; Notable for the following components:   WBC 13.5 (*)    All other components within normal limits  ETHANOL  SALICYLATE LEVEL  I-STAT BETA HCG BLOOD, ED ([facility], [facility], [facility] ONLY)    EKG  EKG Interpretation None       Radiology No results found.  Procedures Procedures (including critical care time)  Medications Ordered in ED Medications - No data to display   Initial Impression / Assessment and Plan / ED Course  I have reviewed the triage vital signs and the nursing notes.  Pertinent labs & imaging results that were available during my care of the patient were reviewed by me and considered in my medical decision making (see chart for details).     27 y.o. female here requesting to get back on her psych meds which she's been off of for 3 months, and requesting referrals to psychiatry. Denies SI or HI, states she has anxiety and depression episodes and sometimes hears voices telling her she's worthless and has racing thoughts. Denies visual hallucinations. Admits to using cocaine, marijuana, and xanax's illicitly. Denies EtOH use. +Smoker. Cessation of tobacco and drugs strongly advised during encounter. Denies medical complaints, here voluntarily. Pt does not appear to be a threat to herself or others, I have no reason to IVC her. I feel she would benefit from  psych eval to stabilize and get meds restarted, however pt does not want to stay tonight because she has a mother who is in the hospital and wants to get her back home tomorrow before coming back for psych help. Discussed that this would basically mean she'd be starting over tomorrow, and encouraged her to stay for psych eval tonight. Pt doesn't want to and would rather be given outpatient resources, although she understands that we wouldn't be able to  restart her meds tonight since 1) she doesn't know the name of one, and 2) this would need to be done by a provider she's seeing in follow up. Discussed with Vikki Ports of TTS to see if she has any additional help she could provide. Labs done in triage are reassuring (WBC 13.5 but this appears chronic, otherwise rest of labs WNL, UDS not yet done), and her exam is unremarkable aside from flat affect. Will await TTS recommendations and reassess shortly.   10:01 PM Vikki Ports of TTS reporting that pt does not meet any inpatient criteria, that family is mostly just concerned about the substance abuse and want to get her into a detox facility and don't believe she'll do it herself, but that pt is not willing to stay tonight (nor does she meet any criteria to need to stay tonight) and that outpatient resources could be given to pt. Vikki Ports agrees that pt does not appear to be a threat to herself or others, and no reason for IVC exists at this time.  Discussed with pt that at this time, she does not appear to be a danger to herself or others, and that since she does not want further psychiatric evaluation tonight, that she will need to f/up with resources given and/or her PCP for ongoing psychiatric care, detox, and any medication refills. Strongly advised tobacco cessation and drug cessation. Discussed f/up with outpt resources for psych help/detox as soon as possible, and/or with her PCP for ongoing medication needs and psychiatric needs. I explained the diagnosis and have  given explicit precautions to return to the ER including for any other new or worsening symptoms. The patient understands and accepts the medical plan as it's been dictated and I have answered their questions. Discharge instructions concerning home care and prescriptions have been given. The patient is STABLE and is discharged to home in good condition.    Final Clinical Impressions(s) / ED Diagnoses   Final diagnoses:  Anxiety  Depression, unspecified depression type  Auditory hallucinations  Medication refill  Tobacco user  Polysubstance abuse [facility])    ED Discharge Orders    [address], [place], [place] 06/22/17 2206    Vanetta Mulders, MD 06/22/17 [phone]

## 2017-06-22 NOTE — ED Notes (Signed)
Pt declined discharge vitals. She also wrote "fuck you" on the signature pad, before erasing it to sign her name. Ambulated out of the ED with her crutches.

## 2017-06-22 NOTE — Discharge Instructions (Signed)
Use the list below to help find outpatient and inpatient psychiatric services, in order to get established and to follow up with for ongoing psychiatric care. They would also be the ones to be restarting any medications that they determine are needed, or you can see your primary care doctor for that as well. Call tomorrow to have ongoing psychiatric care. Stop using drugs and stop smoking cigarettes! Follow up with the psychiatric services and your primary care doctor as soon as possible for ongoing care. Return to the ER for emergent changes or worsening symptoms.

## 2017-06-22 NOTE — ED Triage Notes (Signed)
Pt states she is manic/depressive bipolar and schizophrenic and has not been on her medications for about 3 months Pt [facility] and was told to follow up with her doctor to get back on her medication but she did not  Pt wants to get back on her medication and go to outpt treatment  Pt's aunt is in room states the pt has been having SI and has been using cocaine and weed  Pt denies active SI

## 2017-09-11 ENCOUNTER — Other Ambulatory Visit: Payer: Self-pay

## 2017-09-11 ENCOUNTER — Emergency Department (HOSPITAL_COMMUNITY)
Admission: EM | Admit: 2017-09-11 | Discharge: 2017-09-12 | Disposition: A | Discharge status: Home / Self Care | Payer: Medicaid Other | Attending: Emergency Medicine | Admitting: Emergency Medicine

## 2017-09-11 ENCOUNTER — Encounter (HOSPITAL_COMMUNITY): Payer: Self-pay | Admitting: Emergency Medicine

## 2017-09-11 DIAGNOSIS — R300 Dysuria: Secondary | ICD-10-CM | POA: Diagnosis present

## 2017-09-11 DIAGNOSIS — Z5321 Procedure and treatment not carried out due to patient leaving prior to being seen by health care provider: Secondary | ICD-10-CM | POA: Diagnosis not present

## 2017-09-11 LAB — URINALYSIS, ROUTINE W REFLEX MICROSCOPIC
BACTERIA UA: NONE SEEN
BILIRUBIN URINE: NEGATIVE
Glucose, UA: NEGATIVE mg/dL
Ketones, ur: NEGATIVE mg/dL
NITRITE: NEGATIVE
PROTEIN: NEGATIVE mg/dL
SPECIFIC GRAVITY, URINE: 1.015 (ref 1.005–1.030)
pH: 9 — ABNORMAL HIGH (ref 5.0–8.0)

## 2017-09-11 NOTE — ED Triage Notes (Signed)
Pt reports painful urination and blood in urine for about a week.

## 2017-09-12 LAB — PREGNANCY, URINE: Preg Test, Ur: NEGATIVE

## 2017-09-12 NOTE — ED Notes (Signed)
Patient stated that she was leaving that she was only here for the results of her urine test and to speak with somebody.  Patient complained of wait time and stated she would be leaving because this was too long to wait. When staff returned from speaking with nurse patient and left.

## 2017-09-15 NOTE — ED Notes (Signed)
09/15/2017, Attempted follow- up call , no answer. 

## 2018-12-08 ENCOUNTER — Other Ambulatory Visit: Payer: Self-pay | Admitting: Critical Care Medicine

## 2018-12-08 ENCOUNTER — Other Ambulatory Visit: Payer: Self-pay

## 2018-12-08 ENCOUNTER — Telehealth: Payer: Self-pay

## 2018-12-08 ENCOUNTER — Ambulatory Visit
Admission: EM | Admit: 2018-12-08 | Discharge: 2018-12-08 | Disposition: A | Discharge status: Home / Self Care | Payer: Medicaid Other | Attending: Emergency Medicine | Admitting: Emergency Medicine

## 2018-12-08 DIAGNOSIS — N390 Urinary tract infection, site not specified: Secondary | ICD-10-CM

## 2018-12-08 DIAGNOSIS — F209 Schizophrenia, unspecified: Secondary | ICD-10-CM | POA: Insufficient documentation

## 2018-12-08 DIAGNOSIS — M419 Scoliosis, unspecified: Secondary | ICD-10-CM | POA: Insufficient documentation

## 2018-12-08 DIAGNOSIS — S40862A Insect bite (nonvenomous) of left upper arm, initial encounter: Secondary | ICD-10-CM | POA: Insufficient documentation

## 2018-12-08 DIAGNOSIS — W57XXXA Bitten or stung by nonvenomous insect and other nonvenomous arthropods, initial encounter: Secondary | ICD-10-CM | POA: Insufficient documentation

## 2018-12-08 DIAGNOSIS — Z20822 Contact with and (suspected) exposure to covid-19: Secondary | ICD-10-CM

## 2018-12-08 DIAGNOSIS — F17210 Nicotine dependence, cigarettes, uncomplicated: Secondary | ICD-10-CM | POA: Insufficient documentation

## 2018-12-08 DIAGNOSIS — R5383 Other fatigue: Secondary | ICD-10-CM

## 2018-12-08 DIAGNOSIS — F319 Bipolar disorder, unspecified: Secondary | ICD-10-CM | POA: Insufficient documentation

## 2018-12-08 DIAGNOSIS — R509 Fever, unspecified: Secondary | ICD-10-CM

## 2018-12-08 DIAGNOSIS — Z793 Long term (current) use of hormonal contraceptives: Secondary | ICD-10-CM | POA: Insufficient documentation

## 2018-12-08 DIAGNOSIS — R112 Nausea with vomiting, unspecified: Secondary | ICD-10-CM

## 2018-12-08 DIAGNOSIS — R319 Hematuria, unspecified: Secondary | ICD-10-CM | POA: Insufficient documentation

## 2018-12-08 DIAGNOSIS — Z20828 Contact with and (suspected) exposure to other viral communicable diseases: Secondary | ICD-10-CM | POA: Insufficient documentation

## 2018-12-08 LAB — POCT URINALYSIS DIP (MANUAL ENTRY)
Bilirubin, UA: NEGATIVE
Glucose, UA: NEGATIVE mg/dL
Ketones, POC UA: NEGATIVE mg/dL
Nitrite, UA: NEGATIVE
Protein Ur, POC: 30 mg/dL — AB
Spec Grav, UA: 1.015 (ref 1.010–1.025)
Urobilinogen, UA: 0.2 E.U./dL
pH, UA: 7 (ref 5.0–8.0)

## 2018-12-08 MED ORDER — ONDANSETRON HCL 4 MG PO TABS: 4.0000 mg | ORAL_TABLET | Freq: Four times a day (QID) | ORAL | 0 refills | Status: DC

## 2018-12-08 MED ORDER — AMOXICILLIN-POT CLAVULANATE 875-125 MG PO TABS: 1.0000 | ORAL_TABLET | Freq: Two times a day (BID) | ORAL | 0 refills | Status: DC

## 2018-12-08 MED ORDER — AMOXICILLIN-POT CLAVULANATE 875-125 MG PO TABS: 1.0000 | ORAL_TABLET | Freq: Two times a day (BID) | ORAL | 0 refills | Status: AC

## 2018-12-08 NOTE — ED Triage Notes (Signed)
Pt was treated for uit last week and was seen at [facility], pt was covid tested  There. Pt felt better for a couple days then developed fever and vomiting a couple days . Temp at home reported 102. Took tylenol at 10 am

## 2018-12-08 NOTE — Discharge Instructions (Signed)
COVID: COVID testing ordered. We will follow up with you regarding your results in approximately 5- 7 days  In the meantime: You should remain isolated in your home for 7 days from symptom onset AND greater than 72 hours after symptoms resolution (absence of fever without the use of fever-reducing medication and improvement in respiratory symptoms), whichever is longer Get plenty of rest and push fluids Use OTC medications like ibuprofen or tylenol as needed fever or pain  UTI: Urine showed possible signs of infection Urine culture sent.  We will call you abnormal results.   Push fluids and get plenty of rest.   Augmentin prescribed.  This medication will also cover for possible tick borne illness.   Zofran prescribed.  Use as needed for nausea Take as directed and to completion  Call or go to the ED if you have any new or worsening symptoms such as fever, cough, shortness of breath, chest tightness, chest pain, turning blue, changes in mental status, abdominal pain, flank pain, urinary or bowel changes, nausea, vomiting, etc..Marland Kitchen

## 2018-12-08 NOTE — ED Provider Notes (Addendum)
[facility]   [id number] 12/08/18 Arrival Time: 1338  CC: FEVER  SUBJECTIVE: History from: patient.  Kara StainKaitlyn Love Kara Love is a 28 y.o. female who presents with complaint of fever, chills, fatigue, and swollen lymph nodes that began 1 week ago.  Tmax at home was 102 last night, 99.2 in office today after taking tylenol.  Does admit to tick bite around the time of symptoms.  Tick was attached over night and appeared engorged.  Denies sick exposure to COVID, flu, strep or mono.  Denies recent travel. Works with the general public at [employer].  Has tried OTC tylenol with relief.  Denies aggravating factors.  Was [facility] last week and diagnosed with UTI and BV.  Was treated with metronidazole and macrobid, reports temporary relief in symptoms.  Had negative COVID and strep test at that time.  Reports 2 episodes of vomiting last night, and intermittent loose stools over the past week.  Denies ear pain, HA, rhinorrhea, neck stiffness, congestion, sore throat, cough, chest pain, abdominal pain, watery stools, dark tarry stools, blood in stool, urinary urgency, urinary frequency, vaginal discharge, vaginal odor, vaginal pain, rash.     Pt has IUD  Immunization History  Administered Date(s) Administered  . Influenza,inj,Quad PF,6+ Mos 03/08/2016   ROS: As per HPI.  Past Medical History:  Diagnosis Date  . Asthma   . Drug addiction (HCC)    currently using percocet  . Manic depressive disorder (HCC)   . Pregnant   . Schizophrenia (HCC)   . Scoliosis    Past Surgical History:  Procedure Laterality Date  . MOUTH SURGERY    . TYMPANOSTOMY     Allergies  Allergen Reactions  . Cefzil [Cefprozil] Rash   No current facility-administered medications on file prior to encounter.    Current Outpatient Medications on File Prior to Encounter  Medication Sig Dispense Refill  . levonorgestrel (MIRENA, 52 MG,) 20 MCG/24HR IUD 1 each by Intrauterine route once.    .  methylPREDNISolone (MEDROL DOSEPAK) 4 MG TBPK tablet Take 4 mg by mouth See admin instructions.    . metroNIDAZOLE (FLAGYL) 500 MG tablet Take 500 mg by mouth 2 (two) times daily.     Social History   Socioeconomic History  . Marital status: Single    Spouse name: Not on file  . Number of children: Not on file  . Years of education: Not on file  . Highest education level: Not on file  Occupational History  . Not on file  Social Needs  . Financial resource strain: Not on file  . Food insecurity    Worry: Not on file    Inability: Not on file  . Transportation needs    Medical: Not on file    Non-medical: Not on file  Tobacco Use  . Smoking status: Current Every Day Smoker    Packs/day: 0.50    Types: Cigarettes  . Smokeless tobacco: Never Used  Substance and Sexual Activity  . Alcohol use: No  . Drug use: Yes    Types: Benzodiazepines, Cocaine, Heroin, Marijuana    Comment: stopped THC 3 wks ago, denies heroin or cocaine during pregnancy  . Sexual activity: Yes    Birth control/protection: None  Lifestyle  . Physical activity    Days per week: Not on file    Minutes per session: Not on file  . Stress: Not on file  Relationships  . Social connections    Talks on phone: Not on  file    Gets together: Not on file    Attends religious service: Not on file    Active member of club or organization: Not on file    Attends meetings of clubs or organizations: Not on file    Relationship status: Not on file  . Intimate partner violence    Fear of current or ex partner: Not on file    Emotionally abused: Not on file    Physically abused: Not on file    Forced sexual activity: Not on file  Other Topics Concern  . Not on file  Social History Narrative  . Not on file   Family History  Problem Relation Age of Onset  . Cancer Mother        basal cell skin cancer  . Cancer Maternal Aunt        basal cell skin cancer  . Hypertension Maternal Uncle   . Heart disease Maternal  Grandmother   . Hypertension Maternal Grandmother   . Thyroid disease Maternal Grandmother   . Heart disease Maternal Grandfather   . Hypertension Maternal Grandfather   . Diabetes Paternal Grandfather   . Cancer Paternal Grandfather        prostate    OBJECTIVE:  Vitals:   12/08/18 1354  BP: 102/64  Pulse: (!) 103  Resp: 18  Temp: 99.2 F (37.3 C)  SpO2: 97%     General appearance: alert; appears fatigued, but nontoxic HEENT: NCAT; Ears: EACs clear, TMs pearly gray; Eyes: EOM grossly intact. Nose: patent without rhinorrhea; Throat: oropharynx clear, tonsils not enlarged or erythematous, uvula midline Neck: supple with mild RT side anterior chain LAD; touches chin to chest without difficulty  Lungs: CTA bilaterally without adventitious breath sounds; normal respiratory effort, no cough present Heart: regular rate and rhythm.   Abdomen: soft; normal active bowel sounds; nontender to palpation Back: no CVA tenderness Skin: warm and dry; small erythematous papule to LT underarm where tick was removed, mild surrounding erythema; no obvious rashes Psychological: alert and cooperative; normal mood and affect appropriate for age   RESULTS:  Results for orders placed or performed during the hospital encounter of 12/08/18 (from the past 24 hour(s))  POCT urinalysis dipstick     Status: Abnormal   Collection Time: 12/08/18  2:43 PM  Result Value Ref Range   Color, UA straw (A) yellow   Clarity, UA cloudy (A) clear   Glucose, UA negative negative mg/dL   Bilirubin, UA negative negative   Ketones, POC UA negative negative mg/dL   Spec Grav, UA [id number] [id number] - 1.025   Blood, UA trace-lysed (A) negative   pH, UA 7.0 5.0 - 8.0   Protein Ur, POC =30 (A) negative mg/dL   Urobilinogen, UA 0.2 0.2 or 1.0 E.U./dL   Nitrite, UA Negative Negative   Leukocytes, UA Small (1+) (A) Negative    ASSESSMENT & PLAN:  1. Suspected Covid-19 Virus Infection   2. Urinary tract infection with  hematuria, site unspecified   3. Fever, unspecified   4. Non-intractable vomiting with nausea, unspecified vomiting type   5. Tick bite of axillary region, left, initial encounter     Meds ordered this encounter  Medications  . amoxicillin-clavulanate (AUGMENTIN) 875-125 MG tablet    Sig: Take 1 tablet by mouth every 12 (twelve) hours for 14 days.    Dispense:  28 tablet    Refill:  0    Order Specific Question:   Supervising Provider    Answer:  Blanchie Serve SUE [[id number]]  . ondansetron (ZOFRAN) 4 MG tablet    Sig: Take 1 tablet (4 mg total) by mouth every 6 (six) hours.    Dispense:  12 tablet    Refill:  0    Order Specific Question:   Supervising Provider    Answer:   Raylene Everts [[id number]]   COVID: COVID testing ordered. We will follow up with you regarding your results in approximately 5- 7 days  In the meantime: You should remain isolated in your home for 7 days from symptom onset AND greater than 72 hours after symptoms resolution (absence of fever without the use of fever-reducing medication and improvement in respiratory symptoms), whichever is longer Get plenty of rest and push fluids Use OTC medications like ibuprofen or tylenol as needed fever or pain  UTI/ TICK BITE: Urine showed possible signs of infection Urine culture sent.  We will call you abnormal results.   Push fluids and get plenty of rest.   Augmentin prescribed.  This medication will also cover for possible tick borne illness.   Zofran prescribed.  Use as needed for nausea Take as directed and to completion  Call or go to the ED if you have any new or worsening symptoms such as fever, cough, shortness of breath, chest tightness, chest pain, turning blue, changes in mental status, abdominal pain, flank pain, urinary or bowel changes, nausea, vomiting, etc...   Reviewed expectations re: course of current medical issues. Questions answered. Outlined signs and symptoms indicating need for more  acute intervention. Patient verbalized understanding. After Visit Summary given.          Lestine Box, PA-C 12/08/18 1501    Stacey Drain [place], PA-C 12/08/18 1502

## 2018-12-09 LAB — URINE CULTURE: Culture: NO GROWTH

## 2018-12-13 LAB — NOVEL CORONAVIRUS, NAA: SARS-CoV-2, NAA: NOT DETECTED

## 2019-06-22 DIAGNOSIS — Z01419 Encounter for gynecological examination (general) (routine) without abnormal findings: Secondary | ICD-10-CM | POA: Diagnosis not present

## 2019-06-22 DIAGNOSIS — E559 Vitamin D deficiency, unspecified: Secondary | ICD-10-CM | POA: Diagnosis not present

## 2019-06-22 DIAGNOSIS — N939 Abnormal uterine and vaginal bleeding, unspecified: Secondary | ICD-10-CM | POA: Diagnosis not present

## 2019-06-22 DIAGNOSIS — Z6830 Body mass index (BMI) 30.0-30.9, adult: Secondary | ICD-10-CM | POA: Diagnosis not present

## 2019-06-22 DIAGNOSIS — Z113 Encounter for screening for infections with a predominantly sexual mode of transmission: Secondary | ICD-10-CM | POA: Diagnosis not present

## 2019-06-22 DIAGNOSIS — Z808 Family history of malignant neoplasm of other organs or systems: Secondary | ICD-10-CM | POA: Diagnosis not present

## 2019-06-22 DIAGNOSIS — R5383 Other fatigue: Secondary | ICD-10-CM | POA: Diagnosis not present

## 2019-08-02 DIAGNOSIS — S01502A Unspecified open wound of oral cavity, initial encounter: Secondary | ICD-10-CM | POA: Diagnosis not present

## 2019-08-10 IMAGING — DX DG ANKLE COMPLETE 3+V*R*
3 series · 3 of 3 positions shown · non-contrast
Comparison: None.

CLINICAL DATA: Right foot and ankle injury today when the patient
tripped on steps. Initial encounter.

EXAM:
RIGHT ANKLE - COMPLETE 3+ VIEW

[ankle ap]
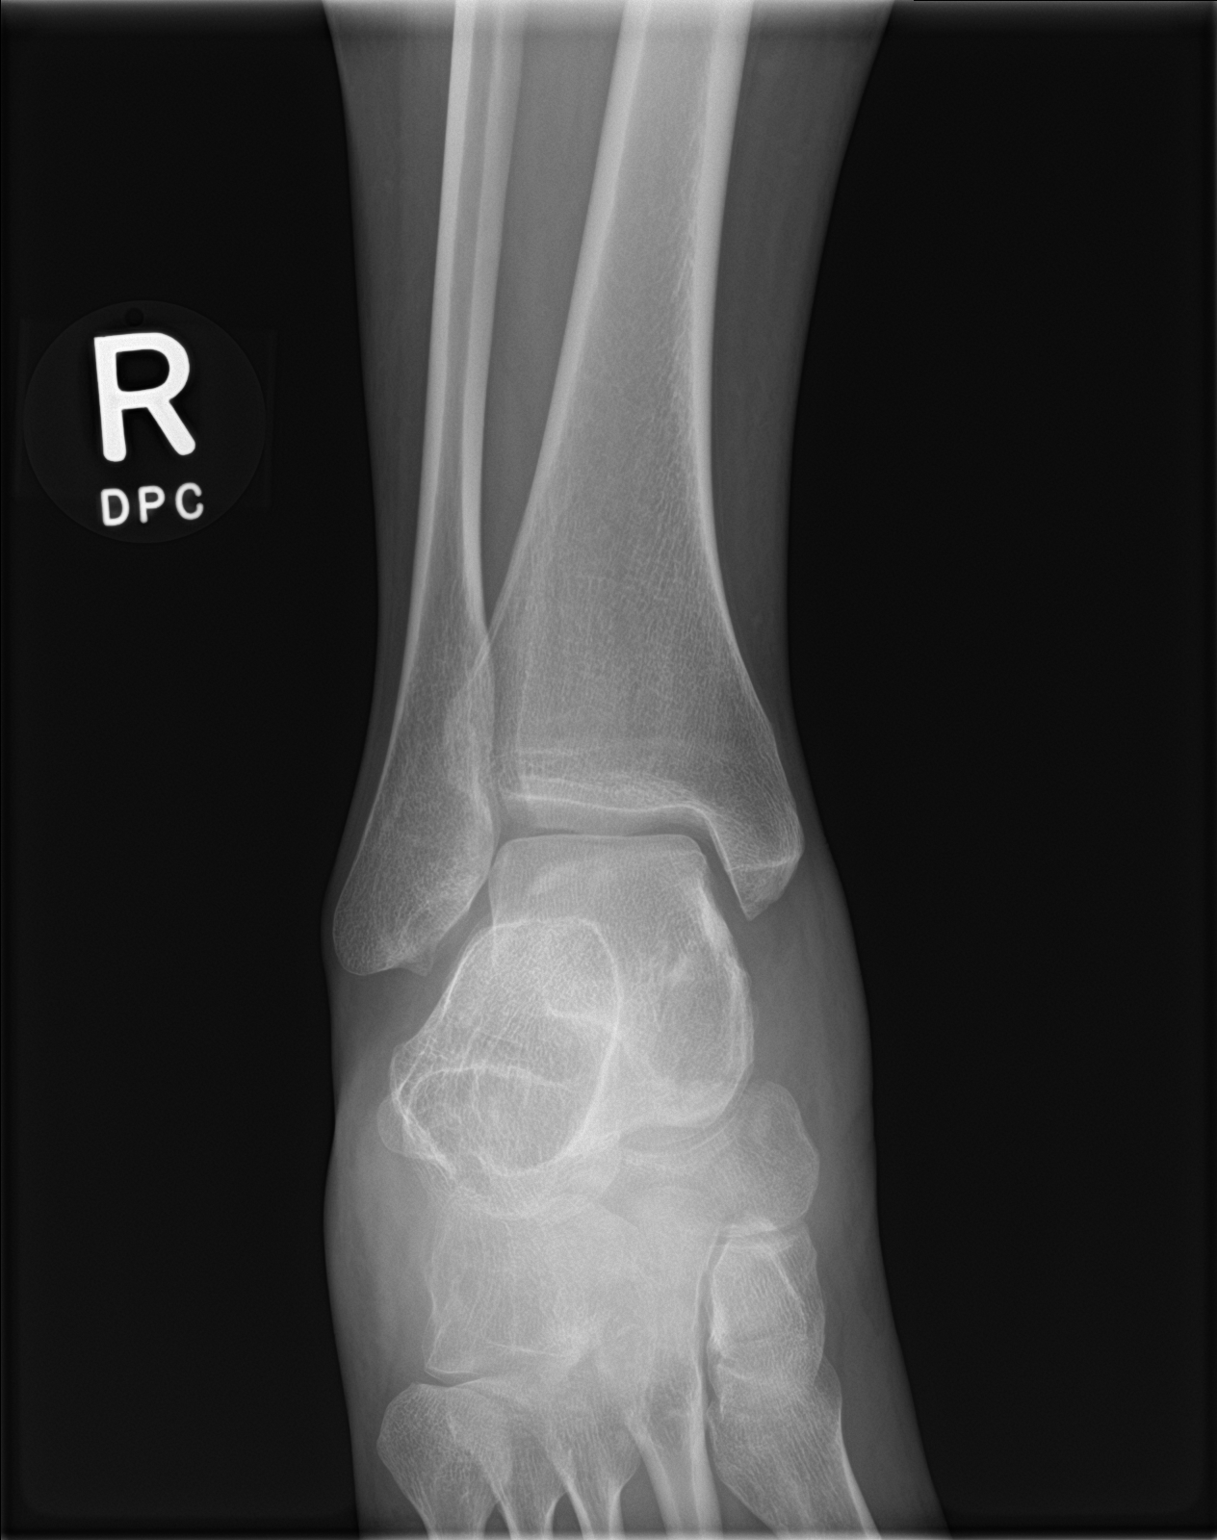

[ankle obl]
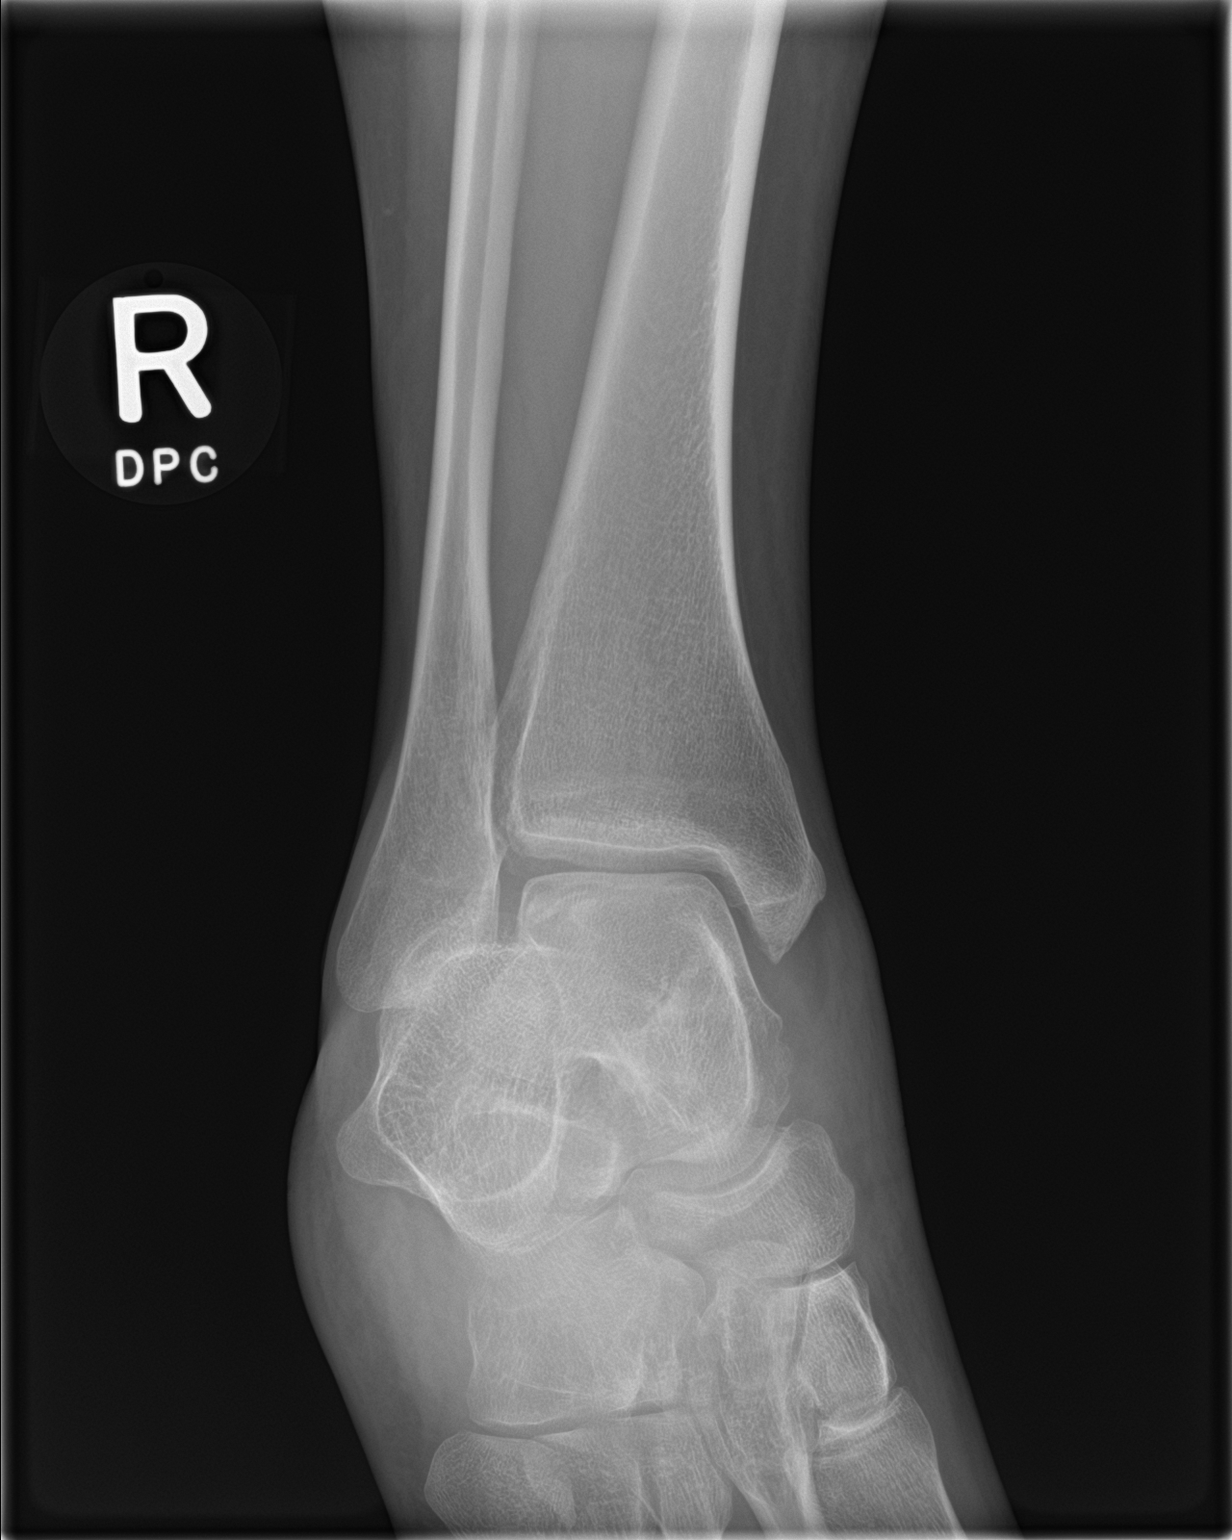

[ankle lat]
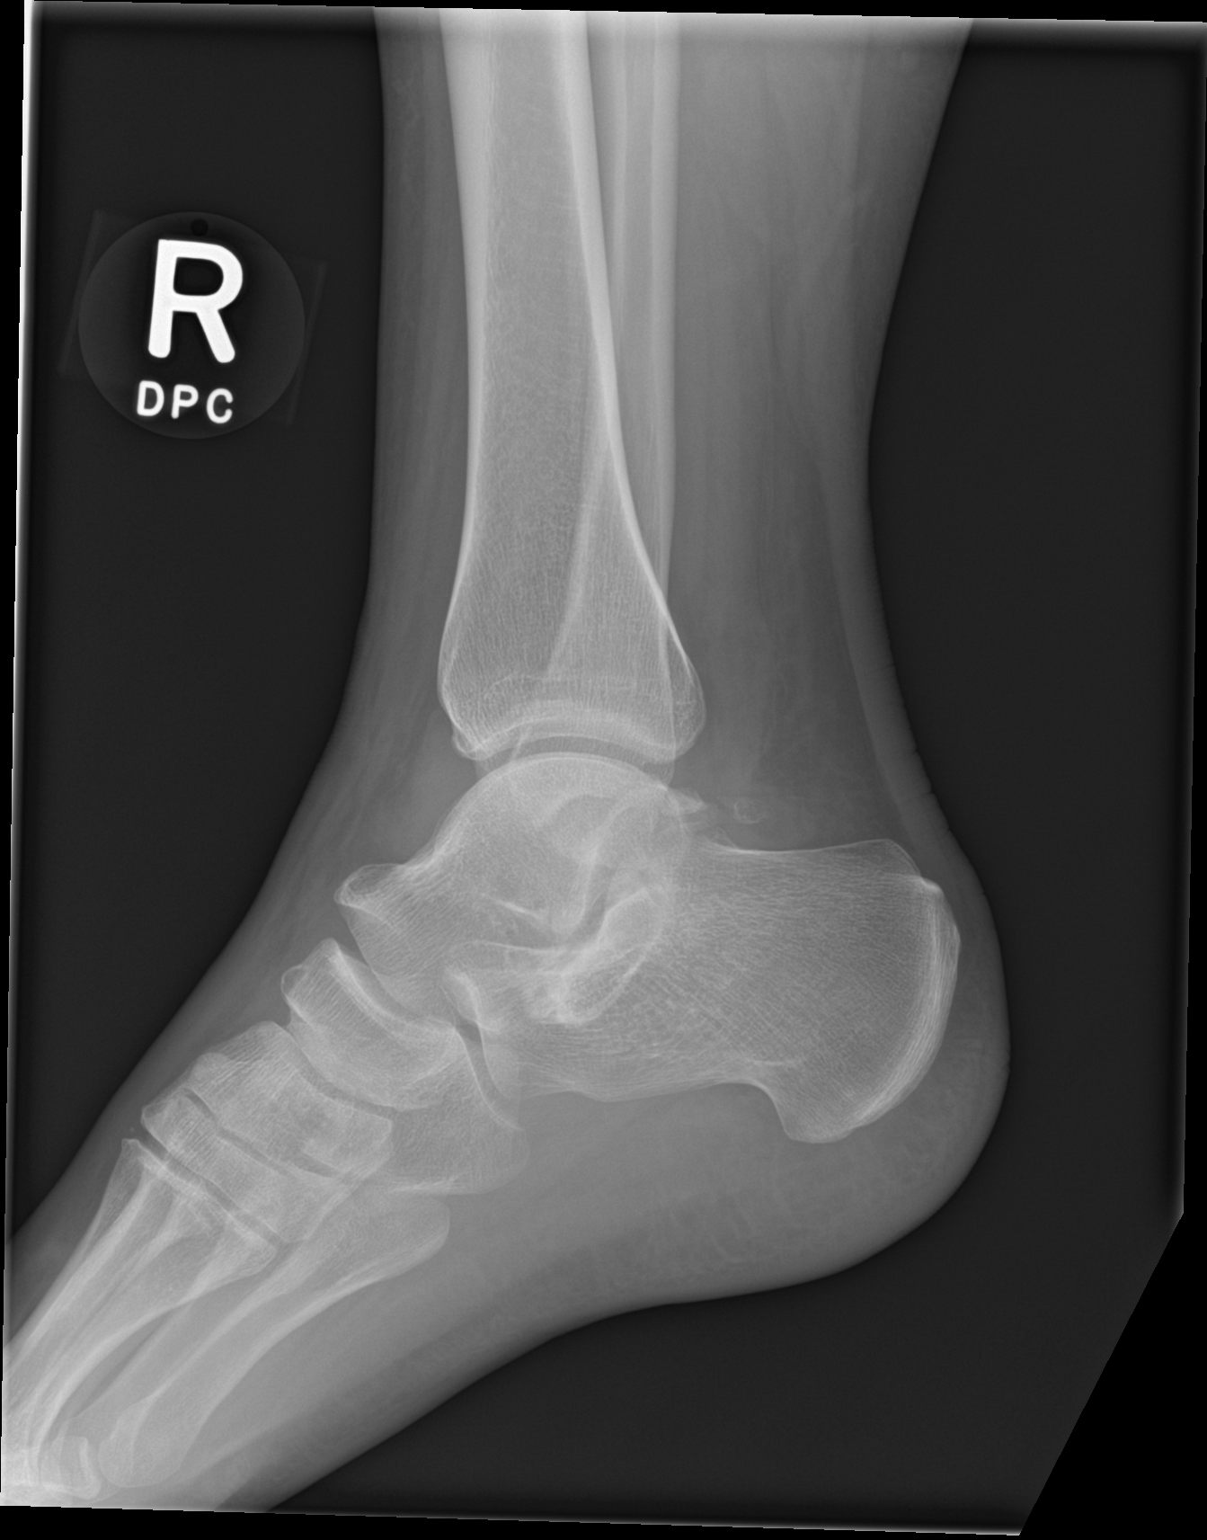

[3 of 3 positions shown; findings below may reference images not displayed]

FINDINGS: There is no evidence of fracture, dislocation, or joint effusion.
There is no evidence of arthropathy or other focal bone abnormality.
Soft tissues are unremarkable.
IMPRESSION: Normal exam.

## 2019-08-10 IMAGING — DX DG FOOT COMPLETE 3+V*R*
3 series · 3 of 3 positions shown · non-contrast
Comparison: None.

CLINICAL DATA: Right foot and ankle injury today when the patient
tripped on steps. Initial encounter.

EXAM:
RIGHT FOOT COMPLETE - 3+ VIEW

[foot ap]
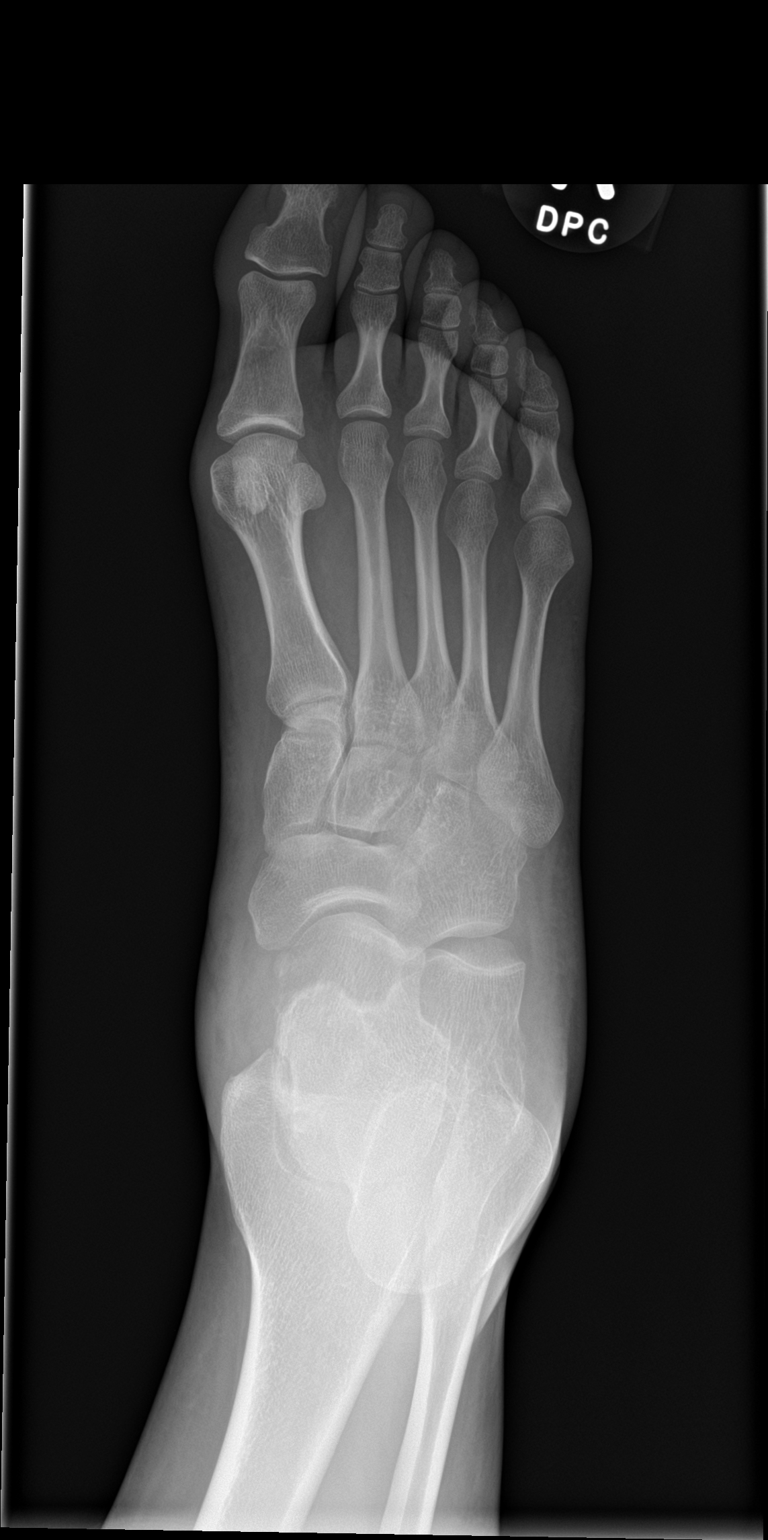

[foot obl]
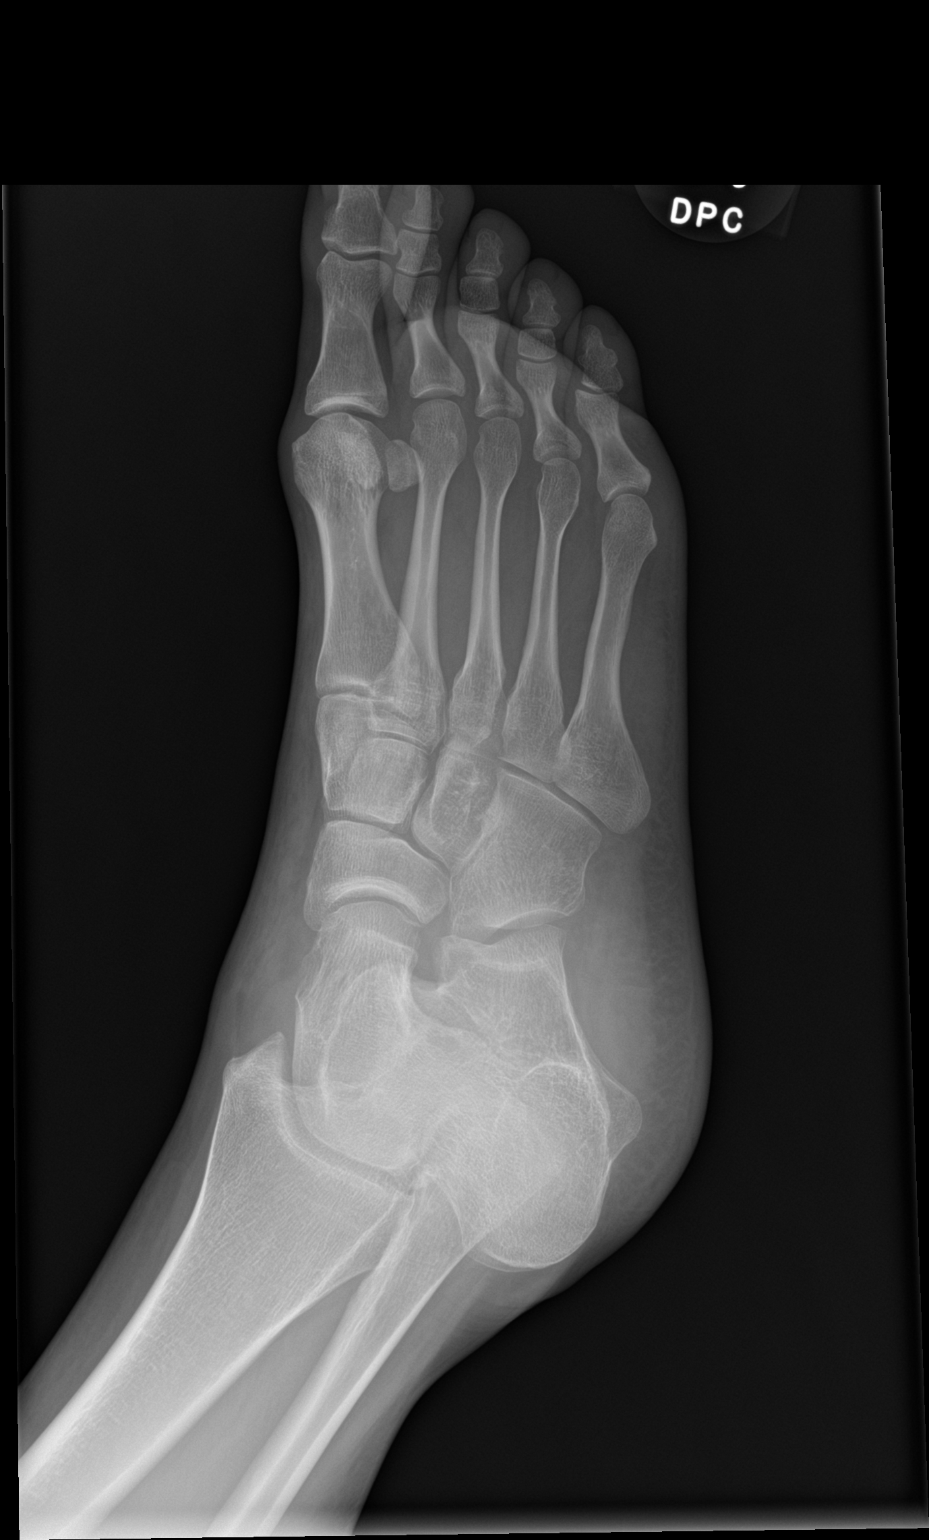

[foot lat]
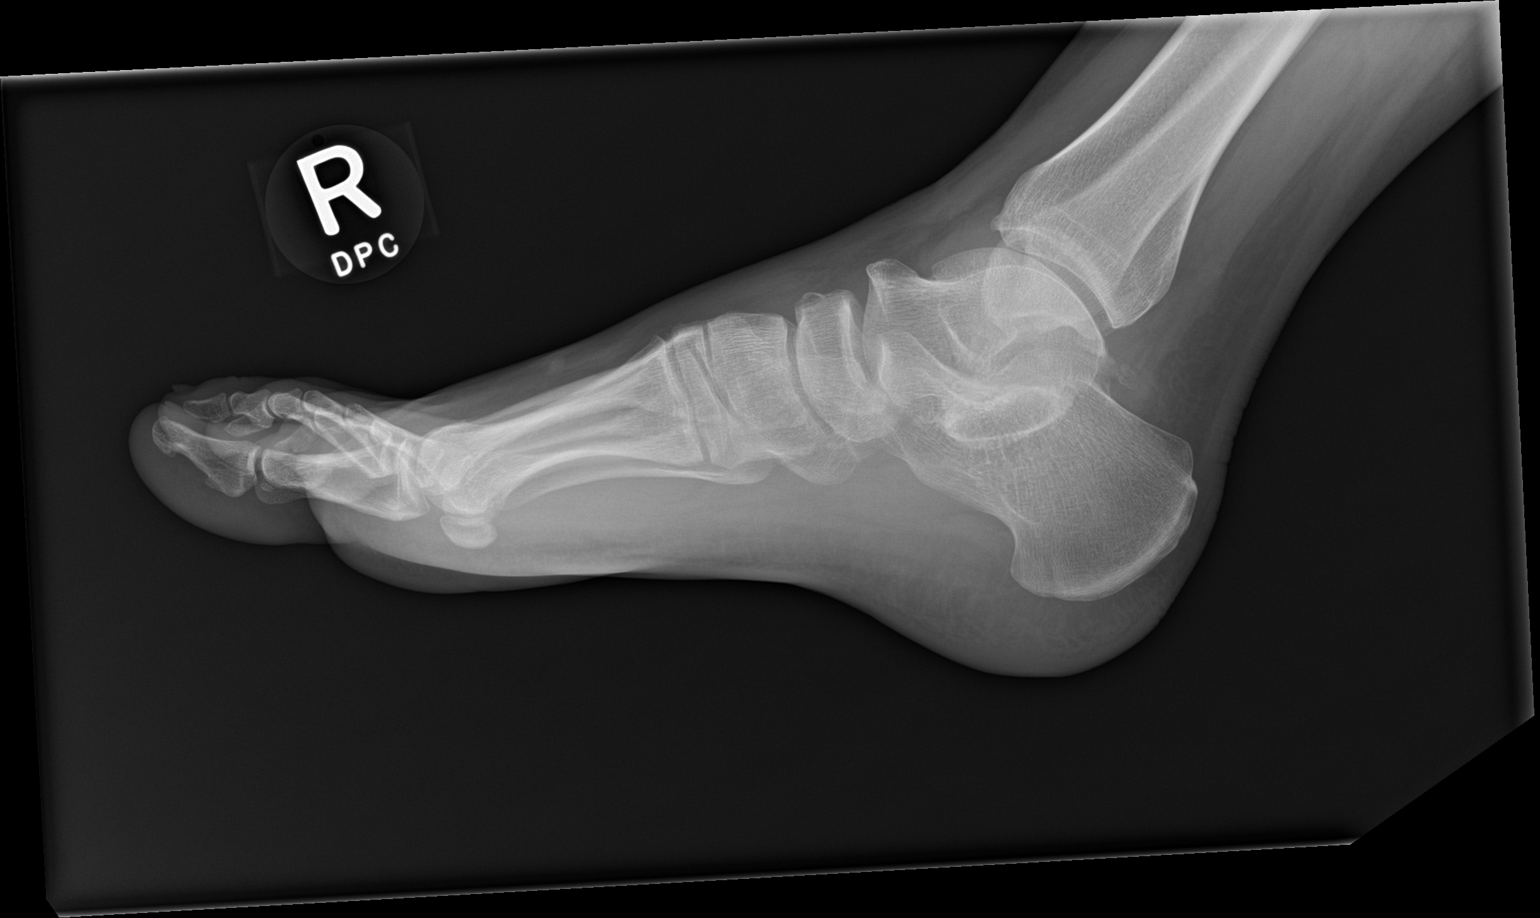

[3 of 3 positions shown; findings below may reference images not displayed]

FINDINGS: There is no evidence of fracture or dislocation. There is no
evidence of arthropathy or other focal bone abnormality. Soft
tissues are unremarkable.
IMPRESSION: Normal exam.

## 2019-12-13 DIAGNOSIS — E559 Vitamin D deficiency, unspecified: Secondary | ICD-10-CM | POA: Diagnosis not present

## 2019-12-13 DIAGNOSIS — Z809 Family history of malignant neoplasm, unspecified: Secondary | ICD-10-CM | POA: Diagnosis not present

## 2019-12-13 DIAGNOSIS — N926 Irregular menstruation, unspecified: Secondary | ICD-10-CM | POA: Diagnosis not present

## 2019-12-13 DIAGNOSIS — Z1211 Encounter for screening for malignant neoplasm of colon: Secondary | ICD-10-CM | POA: Diagnosis not present

## 2019-12-28 ENCOUNTER — Encounter: Payer: Self-pay | Admitting: Gastroenterology

## 2020-01-05 DIAGNOSIS — E669 Obesity, unspecified: Secondary | ICD-10-CM | POA: Diagnosis not present

## 2020-01-05 DIAGNOSIS — F419 Anxiety disorder, unspecified: Secondary | ICD-10-CM | POA: Diagnosis not present

## 2020-01-05 DIAGNOSIS — Z6834 Body mass index (BMI) 34.0-34.9, adult: Secondary | ICD-10-CM | POA: Diagnosis not present

## 2020-01-05 DIAGNOSIS — Z1389 Encounter for screening for other disorder: Secondary | ICD-10-CM | POA: Diagnosis not present

## 2020-01-05 DIAGNOSIS — N342 Other urethritis: Secondary | ICD-10-CM | POA: Diagnosis not present

## 2020-02-01 DIAGNOSIS — Z30433 Encounter for removal and reinsertion of intrauterine contraceptive device: Secondary | ICD-10-CM | POA: Diagnosis not present

## 2020-02-01 DIAGNOSIS — R102 Pelvic and perineal pain: Secondary | ICD-10-CM | POA: Diagnosis not present

## 2020-02-01 DIAGNOSIS — N926 Irregular menstruation, unspecified: Secondary | ICD-10-CM | POA: Diagnosis not present

## 2020-02-01 DIAGNOSIS — N938 Other specified abnormal uterine and vaginal bleeding: Secondary | ICD-10-CM | POA: Diagnosis not present

## 2020-02-16 ENCOUNTER — Telehealth: Payer: Self-pay | Admitting: *Deleted

## 2020-02-16 NOTE — Telephone Encounter (Signed)
Pt was scheduled for a direct colonoscopy for "screening" and is 29.  Phoned pt and asked why she was scheduled for appointment.  She states that she is currently having diarrhea everyday and also had some genetic testing results that she is concerned about.  Explained to her that she will need to see a physician before being scheduled for a procedure.  She verbalized understanding.  Scheduled pt for office visit with Dr. Leone Payor 04/10/20 At 850 am.

## 2020-02-29 DIAGNOSIS — J019 Acute sinusitis, unspecified: Secondary | ICD-10-CM | POA: Diagnosis not present

## 2020-02-29 DIAGNOSIS — Z681 Body mass index (BMI) 19 or less, adult: Secondary | ICD-10-CM | POA: Diagnosis not present

## 2020-03-01 ENCOUNTER — Encounter: Payer: Medicaid Other | Admitting: Gastroenterology

## 2020-04-10 ENCOUNTER — Ambulatory Visit (INDEPENDENT_AMBULATORY_CARE_PROVIDER_SITE_OTHER): Payer: BC Managed Care – PPO | Admitting: Internal Medicine

## 2020-04-10 ENCOUNTER — Encounter: Payer: Self-pay | Admitting: Internal Medicine

## 2020-04-10 ENCOUNTER — Other Ambulatory Visit (INDEPENDENT_AMBULATORY_CARE_PROVIDER_SITE_OTHER): Payer: BC Managed Care – PPO

## 2020-04-10 VITALS — BP 120/68 | HR 64 | Ht 62.0 in | Wt 189.0 lb

## 2020-04-10 DIAGNOSIS — Z1589 Genetic susceptibility to other disease: Secondary | ICD-10-CM

## 2020-04-10 DIAGNOSIS — R197 Diarrhea, unspecified: Secondary | ICD-10-CM | POA: Diagnosis not present

## 2020-04-10 LAB — CBC
HCT: 40.5 % (ref 36.0–46.0)
Hemoglobin: 14 g/dL (ref 12.0–15.0)
MCHC: 34.4 g/dL (ref 30.0–36.0)
MCV: 90.8 fl (ref 78.0–100.0)
Platelets: 297 10*3/uL (ref 150.0–400.0)
RBC: 4.47 Mil/uL (ref 3.87–5.11)
RDW: 12.7 % (ref 11.5–15.5)
WBC: 11.4 10*3/uL — ABNORMAL HIGH (ref 4.0–10.5)

## 2020-04-10 LAB — C-REACTIVE PROTEIN: CRP: <1 mg/dL (ref 0.5–20.0)

## 2020-04-10 NOTE — Patient Instructions (Signed)
Please go to basement for lab tests.  We will refer you to the genetic counselor - you will be notified about that appointment with a call from [place] or them.  I appreciate the opportunity to care for you. Iva Boop, MD, [facility]

## 2020-04-10 NOTE — Progress Notes (Signed)
Kara Love 29 y.o. March 20, 1991 [id number] Referred by: Zelphia Cairo, MD  Assessment & Plan:   Encounter Diagnoses  Name Primary?  . Diarrhea, unspecified type Yes  . Monoallelic mutation of MUTYH gene    Cause of diarrhea not clear relatively chronic issue but temporally associated with work stress so thinking likely IBS.  Based upon her age and the signs and symptoms I would not proceed to colonoscopy directly for that, but evaluate for inflammatory conditions with studies as below.  Regarding mutation of MUTYH gene, it does not appear since she has a mono allelic mutation that she needs a colonoscopy for that at this time.  I think she would be well served by genetic counseling so we will refer.  If she has signs of inflammation particularly an abnormal fecal calprotectin I would proceed with colonoscopy.  Further plans pending results. Orders Placed This Encounter  Procedures  . Calprotectin, Fecal  . CBC  . C-reactive protein  . Sedimentation rate  . Ambulatory referral to Genetics    Note I did not closely review her mental health history question accuracy of schizophrenia diagnosis though there is some history of hallucinations in the past looking through the chart  I appreciate the opportunity to care for this patient. CC: [facility]  Dr. Zelphia Cairo  Subjective:   Chief Complaint: Diarrhea, abnormal MUTYH gene  HPI  Kara Love is a 29 year old white woman with a history of anxiety, depression, schizophrenia and drug use who has complaints of diarrhea and also an abnormal genetic test performed at gynecology.  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  Stressful working conditions attention to detail required low tolerance for mistakes etc.  Since that time she has been having very loose stools to sometimes watery usually after she gets off work in the  morning.  She cannot related to diet or any medication changes.  She does not defecate at work because she has some fear of public restrooms.  She has alprazolam to take for panic attacks and she says that helps her gastrointestinal symptoms when she does use it.  She denies any weight loss.  There does not seem to be any lactose intolerance significant artificial sweetener use.  She has not had any rectal bleeding no significant abdominal pain with this.  Some mild indigestion or heartburn at times.  She has not tried over-the-counter medications for her diarrhea.  At her recent gynecology visit she had genetic testing performed.  She has a mono allelic mutation to MUTYH.  She was referred for a colonoscopy because of this but was directed to the clinic instead for further evaluation.  There is no family history of colon cancer.  She does have some family history of breast cancer as well as pancreatic and prostate.  Additional social history living situation is  also  stressful because her daughter is young and she is working third shift her mother does keep her daughter at night  Allergies  Allergen Reactions  . Cefzil [Cefprozil] Rash   Current Meds  Medication Sig  . ALPRAZolam (XANAX) 0.25 MG tablet Take 0.25 mg by mouth daily as needed.  Marland Kitchen escitalopram (LEXAPRO) 5 MG tablet Take 5 mg by mouth daily.  Marland Kitchen levonorgestrel (MIRENA, 52 MG,) 20 MCG/24HR IUD 1 each by Intrauterine route once.   Past Medical History:  Diagnosis Date  . ASCUS with positive high risk HPV  cervical   . Asthma   . Drug addiction (HCC)    currently using percocet  . Manic depressive disorder (HCC)   . Schizophrenia (HCC)   . Scoliosis   . Vitamin D deficiency    Past Surgical History:  Procedure Laterality Date  . MOUTH SURGERY    . 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   Review of Systems As per HPI all other review of systems negative at this time  Objective:   Physical Exam  BP 120/68   Pulse 64   Ht 5\' 2"  (1.575 m)   Wt 189 lb (85.7 kg)   BMI 34.57 kg/m  NAD, obese ww Anicteric Lungs cta Cor NL abd obese, soft and NT no HSM/mass Alert and oriented x 3

## 2020-04-13 ENCOUNTER — Encounter: Payer: Self-pay | Admitting: Internal Medicine

## 2020-04-13 DIAGNOSIS — Z1589 Genetic susceptibility to other disease: Secondary | ICD-10-CM | POA: Insufficient documentation

## 2020-04-13 DIAGNOSIS — R197 Diarrhea, unspecified: Secondary | ICD-10-CM | POA: Insufficient documentation

## 2020-04-24 DIAGNOSIS — J329 Chronic sinusitis, unspecified: Secondary | ICD-10-CM | POA: Diagnosis not present

## 2020-04-24 DIAGNOSIS — Z681 Body mass index (BMI) 19 or less, adult: Secondary | ICD-10-CM | POA: Diagnosis not present

## 2020-04-24 DIAGNOSIS — F419 Anxiety disorder, unspecified: Secondary | ICD-10-CM | POA: Diagnosis not present

## 2020-05-01 DIAGNOSIS — R39198 Other difficulties with micturition: Secondary | ICD-10-CM | POA: Diagnosis not present

## 2020-05-01 DIAGNOSIS — Z0000 Encounter for general adult medical examination without abnormal findings: Secondary | ICD-10-CM | POA: Diagnosis not present

## 2020-05-01 DIAGNOSIS — Z6833 Body mass index (BMI) 33.0-33.9, adult: Secondary | ICD-10-CM | POA: Diagnosis not present

## 2020-05-01 DIAGNOSIS — R319 Hematuria, unspecified: Secondary | ICD-10-CM | POA: Diagnosis not present

## 2020-05-01 DIAGNOSIS — Z0001 Encounter for general adult medical examination with abnormal findings: Secondary | ICD-10-CM | POA: Diagnosis not present

## 2020-05-30 DIAGNOSIS — J029 Acute pharyngitis, unspecified: Secondary | ICD-10-CM | POA: Diagnosis not present

## 2020-05-30 DIAGNOSIS — Z681 Body mass index (BMI) 19 or less, adult: Secondary | ICD-10-CM | POA: Diagnosis not present

## 2020-05-30 DIAGNOSIS — Z1331 Encounter for screening for depression: Secondary | ICD-10-CM | POA: Diagnosis not present

## 2020-05-31 DIAGNOSIS — Z03818 Encounter for observation for suspected exposure to other biological agents ruled out: Secondary | ICD-10-CM | POA: Diagnosis not present

## 2020-05-31 DIAGNOSIS — Z20822 Contact with and (suspected) exposure to covid-19: Secondary | ICD-10-CM | POA: Diagnosis not present

## 2020-06-01 DIAGNOSIS — Z20822 Contact with and (suspected) exposure to covid-19: Secondary | ICD-10-CM | POA: Diagnosis not present

## 2020-06-03 DIAGNOSIS — T50905D Adverse effect of unspecified drugs, medicaments and biological substances, subsequent encounter: Secondary | ICD-10-CM | POA: Diagnosis not present

## 2020-06-03 DIAGNOSIS — R110 Nausea: Secondary | ICD-10-CM | POA: Diagnosis not present

## 2020-08-30 DIAGNOSIS — F419 Anxiety disorder, unspecified: Secondary | ICD-10-CM | POA: Diagnosis not present

## 2020-08-30 DIAGNOSIS — J329 Chronic sinusitis, unspecified: Secondary | ICD-10-CM | POA: Diagnosis not present

## 2021-01-14 DIAGNOSIS — F419 Anxiety disorder, unspecified: Secondary | ICD-10-CM | POA: Diagnosis not present

## 2021-03-22 DIAGNOSIS — H6691 Otitis media, unspecified, right ear: Secondary | ICD-10-CM | POA: Diagnosis not present

## 2021-03-27 DIAGNOSIS — F419 Anxiety disorder, unspecified: Secondary | ICD-10-CM | POA: Diagnosis not present

## 2021-03-27 DIAGNOSIS — Z6834 Body mass index (BMI) 34.0-34.9, adult: Secondary | ICD-10-CM | POA: Diagnosis not present

## 2021-05-02 DIAGNOSIS — E6609 Other obesity due to excess calories: Secondary | ICD-10-CM | POA: Diagnosis not present

## 2021-05-02 DIAGNOSIS — Z2821 Immunization not carried out because of patient refusal: Secondary | ICD-10-CM | POA: Diagnosis not present

## 2021-05-02 DIAGNOSIS — Z0001 Encounter for general adult medical examination with abnormal findings: Secondary | ICD-10-CM | POA: Diagnosis not present

## 2021-05-02 DIAGNOSIS — Z6834 Body mass index (BMI) 34.0-34.9, adult: Secondary | ICD-10-CM | POA: Diagnosis not present

## 2021-05-02 DIAGNOSIS — F419 Anxiety disorder, unspecified: Secondary | ICD-10-CM | POA: Diagnosis not present

## 2021-08-01 DIAGNOSIS — F419 Anxiety disorder, unspecified: Secondary | ICD-10-CM | POA: Diagnosis not present

## 2021-11-14 DIAGNOSIS — J029 Acute pharyngitis, unspecified: Secondary | ICD-10-CM | POA: Diagnosis not present

## 2021-11-14 DIAGNOSIS — B349 Viral infection, unspecified: Secondary | ICD-10-CM | POA: Diagnosis not present

## 2022-03-05 DIAGNOSIS — F419 Anxiety disorder, unspecified: Secondary | ICD-10-CM | POA: Diagnosis not present

## 2022-03-05 DIAGNOSIS — J329 Chronic sinusitis, unspecified: Secondary | ICD-10-CM | POA: Diagnosis not present

## 2022-03-20 DIAGNOSIS — J329 Chronic sinusitis, unspecified: Secondary | ICD-10-CM | POA: Diagnosis not present

## 2022-03-20 DIAGNOSIS — F419 Anxiety disorder, unspecified: Secondary | ICD-10-CM | POA: Diagnosis not present

## 2022-05-04 DIAGNOSIS — F419 Anxiety disorder, unspecified: Secondary | ICD-10-CM | POA: Diagnosis not present

## 2022-05-04 DIAGNOSIS — Z1331 Encounter for screening for depression: Secondary | ICD-10-CM | POA: Diagnosis not present

## 2022-05-04 DIAGNOSIS — E6609 Other obesity due to excess calories: Secondary | ICD-10-CM | POA: Diagnosis not present

## 2022-05-04 DIAGNOSIS — Z0001 Encounter for general adult medical examination with abnormal findings: Secondary | ICD-10-CM | POA: Diagnosis not present

## 2022-05-04 DIAGNOSIS — Z6830 Body mass index (BMI) 30.0-30.9, adult: Secondary | ICD-10-CM | POA: Diagnosis not present

## 2022-05-04 DIAGNOSIS — E669 Obesity, unspecified: Secondary | ICD-10-CM | POA: Diagnosis not present

## 2022-05-11 DIAGNOSIS — M25562 Pain in left knee: Secondary | ICD-10-CM | POA: Diagnosis not present

## 2022-05-20 DIAGNOSIS — M25562 Pain in left knee: Secondary | ICD-10-CM | POA: Diagnosis not present

## 2022-06-02 DIAGNOSIS — Z6831 Body mass index (BMI) 31.0-31.9, adult: Secondary | ICD-10-CM | POA: Diagnosis not present

## 2022-06-02 DIAGNOSIS — J111 Influenza due to unidentified influenza virus with other respiratory manifestations: Secondary | ICD-10-CM | POA: Diagnosis not present

## 2022-06-15 DIAGNOSIS — M25562 Pain in left knee: Secondary | ICD-10-CM | POA: Diagnosis not present

## 2022-06-23 DIAGNOSIS — S83282A Other tear of lateral meniscus, current injury, left knee, initial encounter: Secondary | ICD-10-CM | POA: Diagnosis not present

## 2022-07-08 DIAGNOSIS — M94262 Chondromalacia, left knee: Secondary | ICD-10-CM | POA: Diagnosis not present

## 2022-07-08 DIAGNOSIS — G8918 Other acute postprocedural pain: Secondary | ICD-10-CM | POA: Diagnosis not present

## 2022-07-08 DIAGNOSIS — M659 Synovitis and tenosynovitis, unspecified: Secondary | ICD-10-CM | POA: Diagnosis not present

## 2022-07-08 DIAGNOSIS — M12862 Other specific arthropathies, not elsewhere classified, left knee: Secondary | ICD-10-CM | POA: Diagnosis not present

## 2022-07-08 DIAGNOSIS — M2342 Loose body in knee, left knee: Secondary | ICD-10-CM | POA: Diagnosis not present

## 2022-07-29 DIAGNOSIS — M25562 Pain in left knee: Secondary | ICD-10-CM | POA: Diagnosis not present

## 2022-07-31 DIAGNOSIS — M25562 Pain in left knee: Secondary | ICD-10-CM | POA: Diagnosis not present

## 2022-08-07 DIAGNOSIS — M25562 Pain in left knee: Secondary | ICD-10-CM | POA: Diagnosis not present

## 2022-08-12 DIAGNOSIS — M25562 Pain in left knee: Secondary | ICD-10-CM | POA: Diagnosis not present

## 2022-08-24 DIAGNOSIS — M25562 Pain in left knee: Secondary | ICD-10-CM | POA: Diagnosis not present

## 2022-09-17 DIAGNOSIS — L501 Idiopathic urticaria: Secondary | ICD-10-CM | POA: Diagnosis not present

## 2022-09-17 DIAGNOSIS — Z6832 Body mass index (BMI) 32.0-32.9, adult: Secondary | ICD-10-CM | POA: Diagnosis not present

## 2022-09-17 DIAGNOSIS — E6609 Other obesity due to excess calories: Secondary | ICD-10-CM | POA: Diagnosis not present

## 2022-09-18 ENCOUNTER — Ambulatory Visit
Admission: EM | Admit: 2022-09-18 | Discharge: 2022-09-18 | Disposition: A | Discharge status: Home / Self Care | Payer: BC Managed Care – PPO

## 2022-09-18 DIAGNOSIS — L509 Urticaria, unspecified: Secondary | ICD-10-CM | POA: Diagnosis not present

## 2022-09-18 MED ORDER — FAMOTIDINE 20 MG PO TABS: 20.0000 mg | ORAL_TABLET | Freq: Two times a day (BID) | ORAL | 0 refills | Status: AC

## 2022-09-18 MED ORDER — FAMOTIDINE 40 MG PO TABS
40.0000 mg | ORAL_TABLET | Freq: Once | ORAL | Status: AC
  Administered 2022-09-18: 40 mg via ORAL

## 2022-09-18 MED ORDER — METHYLPREDNISOLONE SODIUM SUCC 125 MG IJ SOLR
125.0000 mg | Freq: Once | INTRAMUSCULAR | Status: AC
  Administered 2022-09-18: 125 mg via INTRAMUSCULAR

## 2022-09-18 MED ORDER — PREDNISONE 50 MG PO TABS: ORAL_TABLET | ORAL | 0 refills | Status: AC

## 2022-09-18 NOTE — ED Triage Notes (Signed)
Pt c/o hives and wheezing was seen at primary care, yesterday was given steriod shot and benadryl shot that did not seem to alleviate symptoms; pt states her skin is one fire, onset yesterday, feels constricted, difficulty breathing, tightness in throat.

## 2022-09-18 NOTE — Discharge Instructions (Addendum)
Take medication as prescribed. Continue Zyrtec each morning while symptoms persist.  Recommend Benadryl 1 to 2 tablets (25 to 50 mg) at bedtime while symptoms persist. Avoid hot baths or showers while symptoms persist.  Recommend taking lukewarm baths. May apply cool cloths to the area to help with itching or discomfort. Avoid scratching, rubbing, or manipulating the areas while symptoms persist. Recommend Aveeno Colloidal Oatmeal Bath to use to help with drying and itching. If symptoms do not improve with this treatment, recommend following up with your primary care physician.  When discussing a referral to allergy and asthma if symptoms continue to persist. Go to the emergency department if you have worsening rash, difficulty breathing, throat tightness, or other concerns. Follow-up as needed.

## 2022-09-18 NOTE — ED Provider Notes (Signed)
[facility]    CSN: [id number] Arrival date & time: 09/18/22  1478      History   Chief Complaint No chief complaint on file.   HPI Kara Love is a 32 y.o. female.   The history is provided by the patient.   The patient presents for complaints of hives.  Patient states that started yesterday when she woke up.  She states that she went to see her PCP, and they gave her an injection of Benadryl and a steroid.  She states symptoms somewhat improved when she got home.  She states that she took Zyrtec, symptoms had improved when she went to bed.  She states this morning she felt like her symptoms were starting again.  She states that she now has hives over her generalized body area.  She states that the rash is itchy.  She states that she also felt like her throat felt "tight" and that she felt like she was a little short of breath.  She states symptoms have improved since she has been in this clinic.  She denies any new soaps, detergents, foods, medications, lotions, or exposure to plants.  Patient also denies history of seasonal allergies.  Per patient's chart review, she does have a history of asthma.  She states this has happened in the past, but symptoms appear to be worse this time.  Past Medical History:  Diagnosis Date   ASCUS with positive high risk HPV cervical    Asthma    Drug addiction (HCC)    currently using percocet   Manic depressive disorder (HCC)    Schizophrenia (HCC)    Scoliosis    Vitamin D deficiency     Patient Active Problem List   Diagnosis Date Noted   Diarrhea 04/13/2020   Monoallelic mutation of MUTYH gene 04/13/2020    Past Surgical History:  Procedure Laterality Date   MOUTH SURGERY     TYMPANOSTOMY      OB History     Gravida  1   Para  1   Term  1   Preterm      AB      Living  1      SAB      IAB      Ectopic      Multiple  0   Live Births  1            Home Medications    Prior to Admission  medications   Medication Sig Start Date End Date Taking? Authorizing Provider  buPROPion (WELLBUTRIN XL) 150 MG 24 hr tablet Take 150 mg by mouth daily. 05/04/22  Yes [provider]  famotidine (PEPCID) 20 MG tablet Take 1 tablet (20 mg total) by mouth 2 (two) times daily. 09/18/22  Yes Jinger Middlesworth-Warren, Sadie Haber, NP  predniSONE (DELTASONE) 50 MG tablet Take 1 table daily with breakfast for the next 5 days. 09/18/22  Yes Tedi Hughson-Warren, Sadie Haber, NP  ALPRAZolam Prudy Feeler) 0.25 MG tablet Take 0.25 mg by mouth daily as needed. 03/19/20   [provider]  escitalopram (LEXAPRO) 5 MG tablet Take 5 mg by mouth daily. 01/30/20   [provider]  levonorgestrel (MIRENA, 52 MG,) 20 MCG/24HR IUD 1 each by Intrauterine route once.    [provider]    Family History Family History  Problem Relation Age of Onset   Cancer Mother        basal cell skin cancer   Cancer Maternal Aunt  basal cell skin cancer   Hypertension Maternal Uncle    Heart disease Maternal Grandmother    Hypertension Maternal Grandmother    Thyroid disease Maternal Grandmother    Breast cancer Maternal Grandmother    Heart disease Maternal Grandfather    Hypertension Maternal Grandfather    Diabetes Paternal Grandfather    Cancer Paternal Grandfather        prostate    Social History Social History   Tobacco Use   Smoking status: Every Day    Packs/day: .5    Types: Cigarettes   Smokeless tobacco: Never  Substance Use Topics   Alcohol use: No   Drug use: Not Currently    Types: Benzodiazepines, Cocaine, Heroin, Marijuana    Comment: stopped THC 3 wks ago, denies heroin or cocaine during pregnancy     Allergies   Cefzil [cefprozil]   Review of Systems Review of Systems Per HPI  Physical Exam Triage Vital Signs ED Triage Vitals [09/18/22 0924]  Enc Vitals Group     BP 116/77     Pulse Rate (!) 111     Resp 18     Temp      Temp src      SpO2 97 %     Weight       Height      Head Circumference      Peak Flow      Pain Score 5     Pain Loc      Pain Edu?      Excl. in GC?    No data found.  Updated Vital Signs BP 116/77 (BP Location: Right Arm)   Pulse (!) 111   Resp 18   LMP  (LMP Unknown) Comment: pt states she does not have cycle due to [facility]  SpO2 97%   Breastfeeding No   Visual Acuity Right Eye Distance:   Left Eye Distance:   Bilateral Distance:    Right Eye Near:   Left Eye Near:    Bilateral Near:     Physical Exam Vitals and nursing note reviewed.  Constitutional:      General: She is not in acute distress.    Appearance: Normal appearance.  HENT:     Head: Normocephalic.     Nose: Nose normal.     Mouth/Throat:     Mouth: Mucous membranes are moist.  Eyes:     Extraocular Movements: Extraocular movements intact.     Pupils: Pupils are equal, round, and reactive to light.  Cardiovascular:     Rate and Rhythm: Normal rate and regular rhythm.     Pulses: Normal pulses.     Heart sounds: Normal heart sounds.  Pulmonary:     Effort: Pulmonary effort is normal.     Breath sounds: Normal breath sounds.  Abdominal:     General: Bowel sounds are normal.     Palpations: Abdomen is soft.     Tenderness: There is no abdominal tenderness.  Musculoskeletal:     Cervical back: Normal range of motion.  Lymphadenopathy:     Cervical: No cervical adenopathy.  Skin:    General: Skin is warm and dry.     Findings: Erythema and rash present. Rash is urticarial.     Comments: Hives noted to generalized body surface area.  Neurological:     General: No focal deficit present.     Mental Status: She is alert and oriented to person, place, and time.  Psychiatric:  Mood and Affect: Mood normal.        Behavior: Behavior normal.      UC Treatments / Results  Labs (all labs ordered are listed, but only abnormal results are displayed) Labs Reviewed - No data to display  EKG   Radiology No results  found.  Procedures Procedures (including critical care time)  Medications Ordered in UC Medications  methylPREDNISolone sodium succinate (SOLU-MEDROL) 125 mg/2 mL injection 125 mg (has no administration in time range)  famotidine (PEPCID) tablet 40 mg (has no administration in time range)    Initial Impression / Assessment and Plan / UC Course  I have reviewed the triage vital signs and the nursing notes.  Pertinent labs & imaging results that were available during my care of the patient were reviewed by me and considered in my medical decision making (see chart for details).  The patient is well-appearing, she is in no acute distress, she is mildly tachycardic, but this is less likely due to the discomfort of the rash.  Difficult to determine the cause of the patient's hives.  Patient denies history of seasonal allergies or new exposures.  Patient was treated with Solu-Medrol 125mg  IM and Pepcid 40 mg p.o. in the clinic.  Prednisone 50 mg was prescribed for the next 5 days along with Pepcid 20 mg to act as an antagonist.  Patient will continue Zyrtec 10 mg daily that she is currently taking, and will continue Benadryl at bedtime.  Supportive care recommendations were provided and discussed with the patient to include avoidance of hot baths or showers, avoiding scratching or manipulating the areas, and recommended use of Aveeno colloidal oatmeal bath to help with itching and drying of the rash.  Patient was advised that if symptoms worsen and if she develops throat tightness, and has difficulty breathing, recommend that she follow-up in the emergency department for further evaluation.  Also suggested that she follow-up with her PCP for possible referral to allergy and asthma as patient states this has occurred in the past and symptoms are worse at this time.  Patient is in agreement with this plan of care and verbalizes understanding.  All questions were answered.  Patient stable for  discharge.   Final Clinical Impressions(s) / UC Diagnoses   Final diagnoses:  Full body hives     Discharge Instructions      Take medication as prescribed. Continue Zyrtec each morning while symptoms persist.  Recommend Benadryl 1 to 2 tablets (25 to 50 mg) at bedtime while symptoms persist. Avoid hot baths or showers while symptoms persist.  Recommend taking lukewarm baths. May apply cool cloths to the area to help with itching or discomfort. Avoid scratching, rubbing, or manipulating the areas while symptoms persist. Recommend Aveeno Colloidal Oatmeal Bath to use to help with drying and itching. If symptoms do not improve with this treatment, recommend following up with your primary care physician.  When discussing a referral to allergy and asthma if symptoms continue to persist. Go to the emergency department if you have worsening rash, difficulty breathing, throat tightness, or other concerns. Follow-up as needed.     ED Prescriptions     Medication Sig Dispense Auth. Provider   predniSONE (DELTASONE) 50 MG tablet Take 1 table daily with breakfast for the next 5 days. 5 tablet Kinta Martis-Warren, Sadie Haber, NP   famotidine (PEPCID) 20 MG tablet Take 1 tablet (20 mg total) by mouth 2 (two) times daily. 30 tablet Cailean Heacock-Warren, Sadie Haber, NP  PDMP not reviewed this encounter.   Abran Cantor, NP 09/18/22 1031

## 2022-09-29 DIAGNOSIS — F419 Anxiety disorder, unspecified: Secondary | ICD-10-CM | POA: Diagnosis not present

## 2022-09-29 DIAGNOSIS — E6609 Other obesity due to excess calories: Secondary | ICD-10-CM | POA: Diagnosis not present

## 2022-11-05 DIAGNOSIS — R300 Dysuria: Secondary | ICD-10-CM | POA: Diagnosis not present

## 2022-11-05 DIAGNOSIS — N898 Other specified noninflammatory disorders of vagina: Secondary | ICD-10-CM | POA: Diagnosis not present

## 2022-11-05 DIAGNOSIS — Z30431 Encounter for routine checking of intrauterine contraceptive device: Secondary | ICD-10-CM | POA: Diagnosis not present

## 2022-11-05 DIAGNOSIS — R635 Abnormal weight gain: Secondary | ICD-10-CM | POA: Diagnosis not present

## 2022-11-12 ENCOUNTER — Other Ambulatory Visit (HOSPITAL_COMMUNITY): Payer: Self-pay

## 2022-11-16 ENCOUNTER — Other Ambulatory Visit (HOSPITAL_COMMUNITY): Payer: Self-pay

## 2022-11-16 MED ORDER — WEGOVY 0.25 MG/0.5ML ~~LOC~~ SOAJ
0.2500 mg | SUBCUTANEOUS | 0 refills | Status: DC
  Filled 2022-11-16 – 2022-12-24 (×3): qty 2, 28d supply, fill #0

## 2022-11-17 ENCOUNTER — Other Ambulatory Visit (HOSPITAL_COMMUNITY): Payer: Self-pay

## 2022-11-19 ENCOUNTER — Other Ambulatory Visit (HOSPITAL_COMMUNITY): Payer: Self-pay

## 2022-11-19 DIAGNOSIS — E6609 Other obesity due to excess calories: Secondary | ICD-10-CM | POA: Diagnosis not present

## 2022-11-19 DIAGNOSIS — E559 Vitamin D deficiency, unspecified: Secondary | ICD-10-CM | POA: Diagnosis not present

## 2022-11-19 DIAGNOSIS — Z6832 Body mass index (BMI) 32.0-32.9, adult: Secondary | ICD-10-CM | POA: Diagnosis not present

## 2022-11-19 DIAGNOSIS — B354 Tinea corporis: Secondary | ICD-10-CM | POA: Diagnosis not present

## 2022-12-24 ENCOUNTER — Other Ambulatory Visit (HOSPITAL_COMMUNITY): Payer: Self-pay

## 2023-01-11 ENCOUNTER — Other Ambulatory Visit (HOSPITAL_COMMUNITY): Payer: Self-pay

## 2023-01-11 MED ORDER — WEGOVY 0.25 MG/0.5ML ~~LOC~~ SOAJ
0.2500 mg | SUBCUTANEOUS | 0 refills | Status: AC
  Filled 2023-01-11 – 2023-01-21 (×2): qty 2, 28d supply, fill #0

## 2023-01-20 ENCOUNTER — Other Ambulatory Visit: Payer: Self-pay

## 2023-01-20 ENCOUNTER — Encounter (HOSPITAL_COMMUNITY): Payer: Self-pay | Admitting: *Deleted

## 2023-01-20 ENCOUNTER — Emergency Department (HOSPITAL_COMMUNITY)
Admission: EM | Admit: 2023-01-20 | Discharge: 2023-01-20 | Disposition: A | Discharge status: Home / Self Care | Payer: BC Managed Care – PPO | Attending: Emergency Medicine | Admitting: Emergency Medicine

## 2023-01-20 DIAGNOSIS — Z23 Encounter for immunization: Secondary | ICD-10-CM | POA: Diagnosis not present

## 2023-01-20 DIAGNOSIS — X58XXXA Exposure to other specified factors, initial encounter: Secondary | ICD-10-CM | POA: Insufficient documentation

## 2023-01-20 DIAGNOSIS — S61309A Unspecified open wound of unspecified finger with damage to nail, initial encounter: Secondary | ICD-10-CM

## 2023-01-20 DIAGNOSIS — S6991XA Unspecified injury of right wrist, hand and finger(s), initial encounter: Secondary | ICD-10-CM | POA: Insufficient documentation

## 2023-01-20 DIAGNOSIS — S61102A Unspecified open wound of left thumb with damage to nail, initial encounter: Secondary | ICD-10-CM | POA: Diagnosis not present

## 2023-01-20 DIAGNOSIS — S61101A Unspecified open wound of right thumb with damage to nail, initial encounter: Secondary | ICD-10-CM | POA: Insufficient documentation

## 2023-01-20 MED ORDER — TETANUS-DIPHTH-ACELL PERTUSSIS 5-2.5-18.5 LF-MCG/0.5 IM SUSY
0.5000 mL | PREFILLED_SYRINGE | Freq: Once | INTRAMUSCULAR | Status: AC
  Administered 2023-01-20: 0.5 mL via INTRAMUSCULAR
  Filled 2023-01-20: qty 0.5

## 2023-01-20 NOTE — ED Provider Notes (Cosign Needed Addendum)
[place] [facility] AT [facility] Provider Note   CSN: [id number] Arrival date & time: 01/20/23  0746     History  Chief Complaint  Patient presents with   Hand Injury    Kara Love is a 32 y.o. female.   Hand Injury      Home Medications Prior to Admission medications   Medication Sig Start Date End Date Taking? Authorizing Provider  ALPRAZolam (XANAX) 0.25 MG tablet Take 0.25 mg by mouth daily as needed. 03/19/20   [provider]  buPROPion (WELLBUTRIN XL) 150 MG 24 hr tablet Take 150 mg by mouth daily. 05/04/22   [provider]  escitalopram (LEXAPRO) 5 MG tablet Take 5 mg by mouth daily. 01/30/20   [provider]  famotidine (PEPCID) 20 MG tablet Take 1 tablet (20 mg total) by mouth 2 (two) times daily. 09/18/22   Leath-Warren, Sadie Haber, NP  levonorgestrel (MIRENA, 52 MG,) 20 MCG/24HR IUD 1 each by Intrauterine route once.    [provider]  predniSONE (DELTASONE) 50 MG tablet Take 1 table daily with breakfast for the next 5 days. 09/18/22   Leath-Warren, Sadie Haber, NP  Semaglutide-Weight Management (WEGOVY) 0.25 MG/0.5ML SOAJ Inject 0.25 mg into the skin once a week. 01/11/23         Allergies    Cefzil [cefprozil]    Review of Systems   Review of Systems  Physical Exam Updated Vital Signs BP 117/76   Pulse 81   Temp 98.8 F (37.1 C)   Resp 18   Ht 5\' 2"  (1.575 m)   Wt 79.4 kg   SpO2 100%   BMI 32.01 kg/m  Physical Exam Vitals and nursing note reviewed.  Constitutional:      General: She is not in acute distress.    Appearance: She is well-developed.  HENT:     Head: Normocephalic and atraumatic.  Eyes:     Conjunctiva/sclera: Conjunctivae normal.  Cardiovascular:     Rate and Rhythm: Normal rate and regular rhythm.     Heart sounds: No murmur heard. Pulmonary:     Effort: Pulmonary effort is normal. No respiratory distress.     Breath sounds: Normal breath sounds.  Abdominal:      Palpations: Abdomen is soft.  Musculoskeletal:        General: No swelling. Normal range of motion.     Cervical back: Neck supple.     Comments: Lateral nail plate of right thumb is avulsed, able to visualize most of the nailbed, the nail plate medially is intact, no active bleeding  Skin:    General: Skin is warm and dry.     Capillary Refill: Capillary refill takes less than 2 seconds.  Neurological:     General: No focal deficit present.     Mental Status: She is alert and oriented to person, place, and time.  Psychiatric:        Mood and Affect: Mood normal.     ED Results / Procedures / Treatments   Labs (all labs ordered are listed, but only abnormal results are displayed) Labs Reviewed - No data to display  EKG None  Radiology No results found.  Procedures .Nail Removal  Date/Time: 01/20/2023 9:49 AM  Performed by: Ma Rings, PA-C Authorized by: Ma Rings, PA-C   Consent:    Consent obtained:  Verbal   Consent given by:  Patient   Risks discussed:  Permanent nail deformity Universal protocol:  Patient identity confirmed:  Verbally with patient Pre-procedure details:    Skin preparation:  Alcohol Anesthesia:    Anesthesia method: digital  block. Nails trimmed:    Number of nails trimmed:  1 Post-procedure details:    Dressing:  Gauze roll   Procedure completion:  Tolerated well, no immediate complications Comments:     You are now had been partially avulsed, I was able to replace the matrix of the nail back under the proximal nail fold.  Nailbed had been fully irrigated, area cleansed before replacement with alcohol     Medications Ordered in ED Medications  Tdap (BOOSTRIX) injection 0.5 mL (0.5 mLs Intramuscular Given 01/20/23 0948)    ED Course/ Medical Decision Making/ A&P                                 Medical Decision Making DDx: Nail avulsion, nailbed laceration, contusion, fracture, other ED course: Patient partially  avulsed the nail, with the lateral nail plate displaced on exam.  This was done by trying to use an artificial nail to break through the foil on a bottle of Tylenol today.  There is no trauma and no bony tenderness otherwise.  Normal range of motion, no signs of tendon damage.  Tetanus updated today.  No nailbed laceration, was able to visualize the vasculature to the nailbed, no signs of trauma.  I was able to replace the nail plate and trim the artificial nail.  This was wrapped to prevent further trauma or displacement, advised on wound care follow-up and return precautions.  Risk Prescription drug management.           Final Clinical Impression(s) / ED Diagnoses Final diagnoses:  Nail avulsion, finger, initial encounter    Rx / DC Orders ED Discharge Orders     None         Ma Rings, PA-C 01/20/23 7829    Ma Rings, PA-C 01/20/23 5621    Pricilla Loveless, MD 01/21/23 1521

## 2023-01-20 NOTE — ED Triage Notes (Signed)
Pt states she was trying to open a bottle of tylenol and while she was trying to open the foil on top of the bottle she bent her thumbnail and states it is almost off  Pt has thumb wrapped

## 2023-01-20 NOTE — Discharge Instructions (Addendum)
You were seen today after your nail was avulsed ("torn off") we are able to keep it underneath the nail fold which will help it grow back normally.  Keep it bandaged to prevent the nail from displacing and follow-up with a hand doctor.  Use ibuprofen and Tylenol for any discomfort.  Keep it clean and dry.  If you have redness, swelling or drainage come back to the ER.

## 2023-01-21 ENCOUNTER — Other Ambulatory Visit (HOSPITAL_COMMUNITY): Payer: Self-pay

## 2023-01-21 NOTE — ED Notes (Signed)
Pt called 01/21/2023 at 0630 requesting a new work excuse that did not state any limitations and for pt to return to work 01/22/2023. Excuse left at registration for pt pickup

## 2023-01-27 DIAGNOSIS — Z7251 High risk heterosexual behavior: Secondary | ICD-10-CM | POA: Diagnosis not present

## 2023-01-27 DIAGNOSIS — R5383 Other fatigue: Secondary | ICD-10-CM | POA: Diagnosis not present

## 2023-01-27 DIAGNOSIS — Z01419 Encounter for gynecological examination (general) (routine) without abnormal findings: Secondary | ICD-10-CM | POA: Diagnosis not present

## 2023-01-27 DIAGNOSIS — Z6832 Body mass index (BMI) 32.0-32.9, adult: Secondary | ICD-10-CM | POA: Diagnosis not present

## 2023-01-27 DIAGNOSIS — R87616 Satisfactory cervical smear but lacking transformation zone: Secondary | ICD-10-CM | POA: Diagnosis not present

## 2023-01-27 DIAGNOSIS — Z124 Encounter for screening for malignant neoplasm of cervix: Secondary | ICD-10-CM | POA: Diagnosis not present

## 2023-01-27 DIAGNOSIS — Z113 Encounter for screening for infections with a predominantly sexual mode of transmission: Secondary | ICD-10-CM | POA: Diagnosis not present

## 2023-01-27 DIAGNOSIS — Z1151 Encounter for screening for human papillomavirus (HPV): Secondary | ICD-10-CM | POA: Diagnosis not present

## 2023-02-18 ENCOUNTER — Other Ambulatory Visit (HOSPITAL_COMMUNITY): Payer: Self-pay

## 2023-02-18 DIAGNOSIS — Z713 Dietary counseling and surveillance: Secondary | ICD-10-CM | POA: Diagnosis not present

## 2023-02-18 DIAGNOSIS — Z6832 Body mass index (BMI) 32.0-32.9, adult: Secondary | ICD-10-CM | POA: Diagnosis not present

## 2023-02-18 MED ORDER — WEGOVY 0.5 MG/0.5ML ~~LOC~~ SOAJ
0.5000 mg | SUBCUTANEOUS | 1 refills | Status: DC
  Filled 2023-02-18: qty 2, 28d supply, fill #0
  Filled 2023-04-07: qty 2, 28d supply, fill #1

## 2023-04-07 ENCOUNTER — Other Ambulatory Visit (HOSPITAL_COMMUNITY): Payer: Self-pay

## 2023-04-08 DIAGNOSIS — Z6830 Body mass index (BMI) 30.0-30.9, adult: Secondary | ICD-10-CM | POA: Diagnosis not present

## 2023-04-08 DIAGNOSIS — Z713 Dietary counseling and surveillance: Secondary | ICD-10-CM | POA: Diagnosis not present

## 2023-05-07 DIAGNOSIS — Z20828 Contact with and (suspected) exposure to other viral communicable diseases: Secondary | ICD-10-CM | POA: Diagnosis not present

## 2023-05-07 DIAGNOSIS — Z0000 Encounter for general adult medical examination without abnormal findings: Secondary | ICD-10-CM | POA: Diagnosis not present

## 2023-05-07 DIAGNOSIS — E559 Vitamin D deficiency, unspecified: Secondary | ICD-10-CM | POA: Diagnosis not present

## 2023-05-07 DIAGNOSIS — J029 Acute pharyngitis, unspecified: Secondary | ICD-10-CM | POA: Diagnosis not present

## 2023-05-07 DIAGNOSIS — Z1331 Encounter for screening for depression: Secondary | ICD-10-CM | POA: Diagnosis not present

## 2023-05-07 DIAGNOSIS — F419 Anxiety disorder, unspecified: Secondary | ICD-10-CM | POA: Diagnosis not present

## 2023-05-07 DIAGNOSIS — E6609 Other obesity due to excess calories: Secondary | ICD-10-CM | POA: Diagnosis not present

## 2023-05-07 DIAGNOSIS — Z6829 Body mass index (BMI) 29.0-29.9, adult: Secondary | ICD-10-CM | POA: Diagnosis not present

## 2023-05-24 ENCOUNTER — Other Ambulatory Visit (HOSPITAL_COMMUNITY): Payer: Self-pay

## 2023-05-25 ENCOUNTER — Other Ambulatory Visit (HOSPITAL_COMMUNITY): Payer: Self-pay

## 2023-05-25 MED ORDER — WEGOVY 1 MG/0.5ML ~~LOC~~ SOAJ
1.0000 mg | SUBCUTANEOUS | 2 refills | Status: DC
  Filled 2023-05-25: qty 2, 28d supply, fill #0

## 2023-05-27 ENCOUNTER — Other Ambulatory Visit (HOSPITAL_COMMUNITY): Payer: Self-pay

## 2023-05-27 MED ORDER — WEGOVY 0.5 MG/0.5ML ~~LOC~~ SOAJ
0.5000 mg | SUBCUTANEOUS | 1 refills | Status: AC
  Filled 2023-05-27 – 2023-11-10 (×2): qty 2, 28d supply, fill #0
  Filled 2023-12-23: qty 2, 28d supply, fill #1

## 2023-06-04 ENCOUNTER — Other Ambulatory Visit (HOSPITAL_COMMUNITY): Payer: Self-pay

## 2023-06-04 ENCOUNTER — Other Ambulatory Visit: Payer: Self-pay

## 2023-06-11 DIAGNOSIS — Z6829 Body mass index (BMI) 29.0-29.9, adult: Secondary | ICD-10-CM | POA: Diagnosis not present

## 2023-06-11 DIAGNOSIS — Z20828 Contact with and (suspected) exposure to other viral communicable diseases: Secondary | ICD-10-CM | POA: Diagnosis not present

## 2023-06-11 DIAGNOSIS — J101 Influenza due to other identified influenza virus with other respiratory manifestations: Secondary | ICD-10-CM | POA: Diagnosis not present

## 2023-06-11 DIAGNOSIS — R6889 Other general symptoms and signs: Secondary | ICD-10-CM | POA: Diagnosis not present

## 2023-06-11 DIAGNOSIS — E663 Overweight: Secondary | ICD-10-CM | POA: Diagnosis not present

## 2023-06-30 DIAGNOSIS — Z6831 Body mass index (BMI) 31.0-31.9, adult: Secondary | ICD-10-CM | POA: Diagnosis not present

## 2023-06-30 DIAGNOSIS — Z713 Dietary counseling and surveillance: Secondary | ICD-10-CM | POA: Diagnosis not present

## 2023-07-14 ENCOUNTER — Other Ambulatory Visit (HOSPITAL_COMMUNITY): Payer: Self-pay

## 2023-07-14 MED ORDER — WEGOVY 0.25 MG/0.5ML ~~LOC~~ SOAJ
0.2500 mg | SUBCUTANEOUS | 0 refills | Status: DC
  Filled 2023-07-14 – 2023-07-30 (×2): qty 2, 28d supply, fill #0

## 2023-07-30 ENCOUNTER — Other Ambulatory Visit: Payer: Self-pay

## 2023-07-30 ENCOUNTER — Other Ambulatory Visit (HOSPITAL_COMMUNITY): Payer: Self-pay

## 2023-08-16 DIAGNOSIS — E663 Overweight: Secondary | ICD-10-CM | POA: Diagnosis not present

## 2023-08-16 DIAGNOSIS — Z6829 Body mass index (BMI) 29.0-29.9, adult: Secondary | ICD-10-CM | POA: Diagnosis not present

## 2023-08-16 DIAGNOSIS — H1010 Acute atopic conjunctivitis, unspecified eye: Secondary | ICD-10-CM | POA: Diagnosis not present

## 2023-08-16 DIAGNOSIS — B309 Viral conjunctivitis, unspecified: Secondary | ICD-10-CM | POA: Diagnosis not present

## 2023-09-17 ENCOUNTER — Other Ambulatory Visit (HOSPITAL_COMMUNITY): Payer: Self-pay

## 2023-09-22 ENCOUNTER — Other Ambulatory Visit (HOSPITAL_COMMUNITY): Payer: Self-pay

## 2023-09-22 MED ORDER — WEGOVY 0.25 MG/0.5ML ~~LOC~~ SOAJ
0.2500 mg | SUBCUTANEOUS | 0 refills | Status: AC
  Filled 2023-09-22: qty 2, 30d supply, fill #0

## 2023-09-29 DIAGNOSIS — F418 Other specified anxiety disorders: Secondary | ICD-10-CM | POA: Diagnosis not present

## 2023-11-09 DIAGNOSIS — F418 Other specified anxiety disorders: Secondary | ICD-10-CM | POA: Diagnosis not present

## 2023-11-09 DIAGNOSIS — E6609 Other obesity due to excess calories: Secondary | ICD-10-CM | POA: Diagnosis not present

## 2023-11-09 DIAGNOSIS — Z6831 Body mass index (BMI) 31.0-31.9, adult: Secondary | ICD-10-CM | POA: Diagnosis not present

## 2023-11-09 DIAGNOSIS — N390 Urinary tract infection, site not specified: Secondary | ICD-10-CM | POA: Diagnosis not present

## 2023-11-10 ENCOUNTER — Other Ambulatory Visit (HOSPITAL_COMMUNITY): Payer: Self-pay

## 2023-11-22 DIAGNOSIS — R3915 Urgency of urination: Secondary | ICD-10-CM | POA: Diagnosis not present

## 2023-11-22 DIAGNOSIS — Z113 Encounter for screening for infections with a predominantly sexual mode of transmission: Secondary | ICD-10-CM | POA: Diagnosis not present

## 2023-11-22 DIAGNOSIS — N760 Acute vaginitis: Secondary | ICD-10-CM | POA: Diagnosis not present

## 2023-11-22 DIAGNOSIS — R300 Dysuria: Secondary | ICD-10-CM | POA: Diagnosis not present

## 2023-11-22 DIAGNOSIS — L292 Pruritus vulvae: Secondary | ICD-10-CM | POA: Diagnosis not present

## 2023-12-01 DIAGNOSIS — J029 Acute pharyngitis, unspecified: Secondary | ICD-10-CM | POA: Diagnosis not present

## 2023-12-01 DIAGNOSIS — B349 Viral infection, unspecified: Secondary | ICD-10-CM | POA: Diagnosis not present

## 2023-12-16 DIAGNOSIS — R6889 Other general symptoms and signs: Secondary | ICD-10-CM | POA: Diagnosis not present

## 2023-12-16 DIAGNOSIS — B349 Viral infection, unspecified: Secondary | ICD-10-CM | POA: Diagnosis not present

## 2023-12-16 DIAGNOSIS — Z20828 Contact with and (suspected) exposure to other viral communicable diseases: Secondary | ICD-10-CM | POA: Diagnosis not present

## 2023-12-16 DIAGNOSIS — H1010 Acute atopic conjunctivitis, unspecified eye: Secondary | ICD-10-CM | POA: Diagnosis not present

## 2023-12-16 DIAGNOSIS — Z6831 Body mass index (BMI) 31.0-31.9, adult: Secondary | ICD-10-CM | POA: Diagnosis not present

## 2023-12-16 DIAGNOSIS — E6609 Other obesity due to excess calories: Secondary | ICD-10-CM | POA: Diagnosis not present

## 2023-12-24 DIAGNOSIS — Z6833 Body mass index (BMI) 33.0-33.9, adult: Secondary | ICD-10-CM | POA: Diagnosis not present

## 2023-12-24 DIAGNOSIS — Z713 Dietary counseling and surveillance: Secondary | ICD-10-CM | POA: Diagnosis not present

## 2024-01-28 DIAGNOSIS — Z6833 Body mass index (BMI) 33.0-33.9, adult: Secondary | ICD-10-CM | POA: Diagnosis not present

## 2024-01-28 DIAGNOSIS — Z01419 Encounter for gynecological examination (general) (routine) without abnormal findings: Secondary | ICD-10-CM | POA: Diagnosis not present

## 2024-02-15 DIAGNOSIS — Z6834 Body mass index (BMI) 34.0-34.9, adult: Secondary | ICD-10-CM | POA: Diagnosis not present

## 2024-02-15 DIAGNOSIS — Z713 Dietary counseling and surveillance: Secondary | ICD-10-CM | POA: Diagnosis not present

## 2024-02-29 DIAGNOSIS — R070 Pain in throat: Secondary | ICD-10-CM | POA: Diagnosis not present

## 2024-02-29 DIAGNOSIS — Z20822 Contact with and (suspected) exposure to covid-19: Secondary | ICD-10-CM | POA: Diagnosis not present

## 2024-02-29 DIAGNOSIS — J069 Acute upper respiratory infection, unspecified: Secondary | ICD-10-CM | POA: Diagnosis not present

## 2024-04-11 DIAGNOSIS — Z1331 Encounter for screening for depression: Secondary | ICD-10-CM | POA: Diagnosis not present

## 2024-04-11 DIAGNOSIS — Z13220 Encounter for screening for lipoid disorders: Secondary | ICD-10-CM | POA: Diagnosis not present

## 2024-04-11 DIAGNOSIS — Z1339 Encounter for screening examination for other mental health and behavioral disorders: Secondary | ICD-10-CM | POA: Diagnosis not present

## 2024-04-11 DIAGNOSIS — Z0000 Encounter for general adult medical examination without abnormal findings: Secondary | ICD-10-CM | POA: Diagnosis not present

## 2024-04-11 DIAGNOSIS — Z1159 Encounter for screening for other viral diseases: Secondary | ICD-10-CM | POA: Diagnosis not present

## 2024-04-11 DIAGNOSIS — Z131 Encounter for screening for diabetes mellitus: Secondary | ICD-10-CM | POA: Diagnosis not present

## 2024-04-11 DIAGNOSIS — Z114 Encounter for screening for human immunodeficiency virus [HIV]: Secondary | ICD-10-CM | POA: Diagnosis not present

## 2024-05-15 DIAGNOSIS — R059 Cough, unspecified: Secondary | ICD-10-CM | POA: Diagnosis not present

## 2024-05-15 DIAGNOSIS — J069 Acute upper respiratory infection, unspecified: Secondary | ICD-10-CM | POA: Diagnosis not present
